# Patient Record
Sex: Female | Born: 1960 | Race: Black or African American | Hispanic: No | Marital: Single | Smoking: Former smoker
Health system: Southern US, Community
[De-identification: ages and names within clinical notes are randomized; demographics above are authoritative.]

## PROBLEM LIST (undated history)

## (undated) DIAGNOSIS — Z720 Tobacco use: Secondary | ICD-10-CM

## (undated) DIAGNOSIS — E7800 Pure hypercholesterolemia, unspecified: Secondary | ICD-10-CM

## (undated) DIAGNOSIS — Z789 Other specified health status: Secondary | ICD-10-CM

## (undated) DIAGNOSIS — K219 Gastro-esophageal reflux disease without esophagitis: Secondary | ICD-10-CM

## (undated) HISTORY — PX: TUBAL LIGATION: SHX77

## (undated) HISTORY — DX: Tobacco use: Z72.0

## (undated) HISTORY — PX: BACK SURGERY: SHX140

## (undated) HISTORY — PX: ABCESS DRAINAGE: SHX399

## (undated) HISTORY — DX: Gastro-esophageal reflux disease without esophagitis: K21.9

## (undated) HISTORY — PX: ANKLE SURGERY: SHX546

---

## 1998-11-12 ENCOUNTER — Ambulatory Visit (HOSPITAL_COMMUNITY): Admission: RE | Admit: 1998-11-12 | Discharge: 1998-11-12 | Payer: Self-pay | Admitting: Oral Surgery

## 1999-03-10 ENCOUNTER — Ambulatory Visit (HOSPITAL_BASED_OUTPATIENT_CLINIC_OR_DEPARTMENT_OTHER): Admission: RE | Admit: 1999-03-10 | Discharge: 1999-03-10 | Payer: Self-pay | Admitting: Oral Surgery

## 2001-07-29 ENCOUNTER — Emergency Department (HOSPITAL_COMMUNITY): Admission: EM | Admit: 2001-07-29 | Discharge: 2001-07-29 | Payer: Self-pay | Admitting: Emergency Medicine

## 2001-08-25 ENCOUNTER — Inpatient Hospital Stay (HOSPITAL_COMMUNITY): Admission: AD | Admit: 2001-08-25 | Discharge: 2001-08-25 | Payer: Self-pay | Admitting: Obstetrics & Gynecology

## 2001-09-07 ENCOUNTER — Encounter: Admission: RE | Admit: 2001-09-07 | Discharge: 2001-10-03 | Payer: Self-pay | Admitting: Orthopedic Surgery

## 2001-10-12 ENCOUNTER — Inpatient Hospital Stay (HOSPITAL_COMMUNITY): Admission: RE | Admit: 2001-10-12 | Discharge: 2001-10-14 | Payer: Self-pay | Admitting: Orthopaedic Surgery

## 2001-10-12 ENCOUNTER — Encounter: Payer: Self-pay | Admitting: Orthopedic Surgery

## 2001-10-25 ENCOUNTER — Encounter: Admission: RE | Admit: 2001-10-25 | Discharge: 2002-01-23 | Payer: Self-pay | Admitting: Orthopedic Surgery

## 2002-03-13 ENCOUNTER — Other Ambulatory Visit: Admission: RE | Admit: 2002-03-13 | Discharge: 2002-03-13 | Payer: Self-pay | Admitting: Obstetrics and Gynecology

## 2003-09-10 ENCOUNTER — Other Ambulatory Visit: Admission: RE | Admit: 2003-09-10 | Discharge: 2003-09-10 | Payer: Self-pay | Admitting: Obstetrics and Gynecology

## 2003-09-11 ENCOUNTER — Ambulatory Visit (HOSPITAL_COMMUNITY): Admission: RE | Admit: 2003-09-11 | Discharge: 2003-09-11 | Payer: Self-pay | Admitting: Obstetrics and Gynecology

## 2003-09-11 ENCOUNTER — Encounter: Payer: Self-pay | Admitting: Obstetrics and Gynecology

## 2004-10-02 ENCOUNTER — Other Ambulatory Visit: Admission: RE | Admit: 2004-10-02 | Discharge: 2004-10-02 | Payer: Self-pay | Admitting: Family Medicine

## 2004-11-12 ENCOUNTER — Ambulatory Visit (HOSPITAL_COMMUNITY): Admission: RE | Admit: 2004-11-12 | Discharge: 2004-11-12 | Payer: Self-pay | Admitting: Family Medicine

## 2005-10-05 ENCOUNTER — Other Ambulatory Visit: Admission: RE | Admit: 2005-10-05 | Discharge: 2005-10-05 | Payer: Self-pay | Admitting: Family Medicine

## 2006-01-08 ENCOUNTER — Ambulatory Visit (HOSPITAL_COMMUNITY): Admission: RE | Admit: 2006-01-08 | Discharge: 2006-01-08 | Payer: Self-pay | Admitting: Family Medicine

## 2006-02-08 ENCOUNTER — Emergency Department (HOSPITAL_COMMUNITY): Admission: EM | Admit: 2006-02-08 | Discharge: 2006-02-08 | Payer: Self-pay | Admitting: Emergency Medicine

## 2006-02-09 ENCOUNTER — Emergency Department (HOSPITAL_COMMUNITY): Admission: EM | Admit: 2006-02-09 | Discharge: 2006-02-09 | Payer: Self-pay | Admitting: Emergency Medicine

## 2006-02-21 ENCOUNTER — Emergency Department (HOSPITAL_COMMUNITY): Admission: EM | Admit: 2006-02-21 | Discharge: 2006-02-21 | Payer: Self-pay | Admitting: Emergency Medicine

## 2006-02-26 ENCOUNTER — Encounter: Admission: RE | Admit: 2006-02-26 | Discharge: 2006-05-27 | Payer: Self-pay | Admitting: Family Medicine

## 2006-07-30 ENCOUNTER — Encounter: Admission: RE | Admit: 2006-07-30 | Discharge: 2006-07-30 | Payer: Self-pay | Admitting: Family Medicine

## 2006-10-21 ENCOUNTER — Other Ambulatory Visit: Admission: RE | Admit: 2006-10-21 | Discharge: 2006-10-21 | Payer: Self-pay | Admitting: Family Medicine

## 2007-10-25 ENCOUNTER — Other Ambulatory Visit: Admission: RE | Admit: 2007-10-25 | Discharge: 2007-10-25 | Payer: Self-pay | Admitting: Family Medicine

## 2008-03-02 ENCOUNTER — Emergency Department (HOSPITAL_COMMUNITY): Admission: EM | Admit: 2008-03-02 | Discharge: 2008-03-02 | Payer: Self-pay | Admitting: Emergency Medicine

## 2008-10-24 ENCOUNTER — Other Ambulatory Visit: Admission: RE | Admit: 2008-10-24 | Discharge: 2008-10-24 | Payer: Self-pay | Admitting: Family Medicine

## 2009-08-04 ENCOUNTER — Emergency Department (HOSPITAL_COMMUNITY): Admission: EM | Admit: 2009-08-04 | Discharge: 2009-08-04 | Payer: Self-pay | Admitting: Emergency Medicine

## 2009-08-09 ENCOUNTER — Inpatient Hospital Stay (HOSPITAL_COMMUNITY): Admission: RE | Admit: 2009-08-09 | Discharge: 2009-08-12 | Payer: Self-pay | Admitting: Orthopaedic Surgery

## 2009-11-01 ENCOUNTER — Other Ambulatory Visit: Admission: RE | Admit: 2009-11-01 | Discharge: 2009-11-01 | Payer: Self-pay | Admitting: Family Medicine

## 2011-01-18 ENCOUNTER — Encounter: Payer: Self-pay | Admitting: Family Medicine

## 2011-04-04 LAB — CBC
HCT: 36.3 % (ref 36.0–46.0)
Hemoglobin: 12.3 g/dL (ref 12.0–15.0)
MCV: 85.8 fL (ref 78.0–100.0)
Platelets: 334 10*3/uL (ref 150–400)
RDW: 13.2 % (ref 11.5–15.5)
WBC: 5.8 10*3/uL (ref 4.0–10.5)

## 2011-05-12 NOTE — Op Note (Signed)
NAMEMISSOURI, LAPAGLIA                ACCOUNT NO.:  [id number]   MEDICAL RECORD NO.:  [id number]          PATIENT TYPE:  OIB   LOCATION:  5040                         FACILITY:  [facility]   PHYSICIAN:  Vanita Panda. Magnus Ivan, M.D.DATE OF BIRTH:  19-Nov-1961   DATE OF PROCEDURE:  08/09/2009  DATE OF DISCHARGE:                               OPERATIVE REPORT   PREOPERATIVE DIAGNOSES:  Left complex ankle fracture, left complex and  unstable bimalleolar ankle fracture.   POSTOPERATIVE DIAGNOSES:  Left complex ankle fracture, left complex and  unstable bimalleolar ankle fracture.   PROCEDURES:  1. Open reduction and internal fixation of left fibula.  2. Excision of left small medial malleolus piece.  3. Repair of deltoid ligament, left ankle.   SURGEON:  Vanita Panda. Magnus Ivan, MD   ANESTHESIA:  1. Popliteal block, left leg.  2. General.   TOURNIQUET TIME:  85 minutes.   BLOOD LOSS:  Minimal.   COMPLICATIONS:  None.   INDICATIONS:  Briefly, Megan Spears is a 50 year old who last weekend fell  off her porch sustaining injury to her left ankle, ankle subluxation  with fractures of the fibula, and a small medial malleolus piece, this  was an unstable injury.  She was placed appropriately in the splint and  given followup in my office.  She was recommended open reduction and  internal fixation.  She understood the need to proceed with surgery.   DESCRIPTION OF PROCEDURE:  After informed consent was obtained, the  appropriate left ankle was marked.  Anesthesia obtained a popliteal  block.  She was then brought to the operating room, placed supine on the  operating table.  General anesthesia was then obtained.  A bump was  placed under her left hip to internally rotate her leg.  A nonsterile  tourniquet was placed around her upper thigh and her leg was prepped and  draped with DuraPrep and sterile drapes including sterile stockinette.  A time-out was called to identify the correct patient  and correct left  ankle.  I then used Esmarch to wrap up the ankle and tourniquet was  inflated to 300 mL pressure.  An incision was made over the fibula and  carried down to the fracture site.  I cleaned the fracture debris of  hematoma.  You could see there was a straight port oblique fracture.  I  then used reduction forceps and reduced the fracture anatomically.  I  attempted to place a lag screw from anterior to posterior, but it was  difficult to get the correct angle due to her small area of bone.  At  that point, we placed buttress plate instead.  Katrinka Blazing and nephew anatomic  distal fibular plate was then chosen and I was able to reduce the plate  along the lateral cortex of the fibula.  I then secured this with 3  screws proximally and 3 screws distally, and this held the fracture in  anatomically reduced position.  Next, attention was turned to the small  medial malleolus piece.  An incision was made directly in the medial  malleolus and the piece  was found to be small with some cartilage  involved with too small to put any type of screw in.  I excised this  piece and was able to bring the deltoid ligament back to its resting  position.  I repaired this deltoid tear with interrupted #2 FiberWire  suture.  Under direct fluoroscopic guidance, I assessed the fracture as  well as the stability of the ankle mortise and syndesmosis, stressed in  the ankle joint, and direct fluoroscopy showed no instability.  I then  copiously irrigated the wounds and closed the deep tissues over both  areas with 0 Vicryl followed by 2-0 Vicryl on subcutaneous tissue and  interrupted 2-0 nylon on the skin.  The skin incisions were infiltrated  with 0.25% plain Marcaine.  Xeroform followed by well-padded sterile  dressing and splint was placed on her ankle.  The tourniquet was let  down at 85 minutes and toes pink and nicely.  The patient was then  awakened, extubated, and taken to recovery room in stable  condition.  All final counts were correct and there were no complications noted.      Vanita Panda. Magnus Ivan, M.D.  Electronically Signed     CYB/MEDQ  D:  08/09/2009  T:  08/10/2009  Job:  [id number]

## 2011-05-12 NOTE — Discharge Summary (Signed)
NAMECHASIDY, Megan Spears                ACCOUNT NO.:  [id number]   MEDICAL RECORD NO.:  [id number]          PATIENT TYPE:  INP   LOCATION:  5040                         FACILITY:  [facility]   PHYSICIAN:  Vanita Panda. Magnus Ivan, M.D.DATE OF BIRTH:  June 04, 1961   DATE OF ADMISSION:  08/09/2009  DATE OF DISCHARGE:  08/12/2009                               DISCHARGE SUMMARY   PREOPERATIVE DIAGNOSIS:  Left unstable ankle fracture.   DISCHARGE DIAGNOSIS:  Left unstable ankle fracture.   PREOPERATIVE PROCEDURE:  Open reduction and internal fixation of left  fibula and repair of left deltoid ligament on April 2010.   HOSPITAL COURSE:  [name] is a 50 year old female who sustained a  mechanical fall about 6 days prior to admission.  She was found to have  an ankle fracture subluxation, who has unstable fracture.  After a  period of time, it allow swelling to subside.  She was taken to the  operating room on the day of admission where she underwent open  reduction and internal fixation of the left fibular fracture and then  repair of deltoid ligament disruption.  For detailed description of the  operation, please refer the dictated operative in the patient medical  record.  Postoperatively, she was admitted to the orthopedic floor bed.  We kept her over the week after the weekend due to the fact that she  needs extra help with physical therapy and that she lives alone, a 3 in  1 bedside commode was set up for delivery for her.  By the day of  discharge, she was safe from physical therapy standpoint.  She was  tolerating oral diet as well as oral pain medications.  This was felt,  she could be discharged safely to home.   DISPOSITION:  To home.   DISCHARGE INSTRUCTIONS:  While she is at home, she will remain  nonweightbearing on her left lower extremity.  She will elevate this  extremity for swelling and follow up appointment will be established in  the office in 2 weeks.   DISCHARGE  MEDICATIONS:  Percocet.      Vanita Panda. Magnus Ivan, M.D.  Electronically Signed     CYB/MEDQ  D:  08/12/2009  T:  08/12/2009  Job:  [id number]

## 2011-05-15 NOTE — Op Note (Signed)
[place]. [facility]  Patient:    Megan Spears, Megan Spears Visit Number: [id number] MRN: [id number]          Service Type: DSU Location: 5000 [id number] Attending Physician:  Susanne Greenhouse Dictated by:   Patricia Nettle, M.D. Proc. Date: 10/12/01 Admit Date:  10/12/2001                             Operative Report  PREOPERATIVE DIAGNOSIS: 1. Right L5 radiculopathy. 2. Lateral recess stenosis, L4-5. 3. Foraminal stenosis L5-S1. 4. Degenerative disk disease L4-5, L5-S1.  POSTOPERATIVE DIAGNOSIS:  OPERATION PERFORMED: 1. Right L5 hemilaminectomy. 2. Right L4-5 laminotomy. 3. Partial facetectomy, L4-5. 4. Foraminotomy, L5-S1 right. 5. Microdiskectomy L4-5. 6. Microdiskectomy L5-S1. 7. Autogenous fat graft.  SURGEON:  Dr. Sharolyn Douglas, M.D.  ASSISTANT:  Greig Castilla, [facility] PA  ANESTHESIA:  General endotracheal.  ESTIMATED BLOOD LOSS:  100 cc.  COMPLICATIONS:  None.  INDICATIONS FOR PROCEDURE:  The patient is a 50 year old female with right lower extremity pain.  MRI showed degenerative disk disease at L4-5 and L5-S1 with bilateral foraminal stenosis at L5-S1.  There is lateral recess stenosis at the L4-5 level.  She also complains of right upper extremity pain and has an MRI scan showing a right posterolateral disk protrusion at C5-6.  She has 3/5 weakness in the right extensor hallucis longus.  She has failed conservative therapy.  DESCRIPTION OF PROCEDURE:  The patient was properly identified in the holding area as Carvel Getting and taken to the operating room.  She underwent general endotracheal anesthesia without difficulty.  She was turned prone onto the AcroMed four posterior positioning frame.  Care was taken to pad all bony prominence.  Prophylactic antibiotics were given.  Her back was prepped and draped in the usual sterile fashion.  A 5 cm midline incision was made over the L4 to S1 spinous processes.  Dissection was carried down to the  fascia. The fascia was incised and the paraspinal musculature was exposed through the facet joints.  The lamina and interspaces were defined.  Intraoperative x-ray was taken to confirm levels.  A right L5 hemilaminectomy was performed.  A laminotomy was carried out, taking the inferior third of the L4 right lamina. The L5 nerve root was identified and found to compressed within the lateral recess from ligamentum flavum hypertrophy as well as L5 superior facet overgrowth. This was decompressed laterally to the medial pedicle wall.  A foraminotomy was then carried out at the L5-S1 level tracing the nerve out and through the foramen.  Care was taken to protect the pars interarticularis at L5.  Diskectomies were carried out at the L4-5 and L5-S1 levels to further decompress the L5 root.  The wound was copiously irrigated.  Adequate hemostasis was achieved.  The nerve was again examined and found to be mobile and passing freely through the L5-S1 foramen.  The fascia was closed using #1 running Vicryl sutures.  The subcutaneous tissue was approximated using 2-0 Vicryl suture.  A subcutaneous drain was placed.  The skin was approximated using 3-0 Monocryl subcuticular sutures.  Sterile dressing was applied.  The patient was flipped supine.  She was extubated without difficulty.  She was wiggling her toes and transferred to recovery room in stable condition. Dictated by:   Patricia Nettle, M.D. Attending Physician:  Susanne Greenhouse DD:  10/12/01 TD:  10/13/01 Job: [id number]

## 2011-05-15 NOTE — H&P (Signed)
[place]. [facility]  Patient:    Megan Spears, Megan Spears Visit Number: [id number] MRN: [id number]          Service Type: Attending:  Patricia Nettle, M.D. Dictated by:   Ralene Bathe, P.A.                           History and Physical  DATE OF BIRTH:  26-Nov-1961.  CHIEF COMPLAINT:  Back and right leg pain.  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  She had sudden onset of right buttock pain and was unable to complete a normal shift and went on to develop right lower extremity pain with radiation to the calf and dorsum of the foot.  She was initially seen in [facility] ER as well as [facility] prior to being referred to our office.  At that time straight leg raise was positive and EHL was weak on the right when compared to the left.  She was sent for MRI, which demonstrated degenerative disk disease at L4-5, L5-S1, with foraminal stenosis L5-S1, right greater than left.  The patient has been unable to work.  She has also _____ got a disk herniation in the cervical spine as well, and MRI demonstrated 5-6 causing mass effect on the right C6 nerve root.  She has also had demonstrated weakness into the upper extremities.  The back symptoms preceded the neck; therefore, it was decided to undergo lumbar decompression first and to give the neck a chance to conservatively improve.  At this time she wishes to proceed.  She wishes to return back into the work force.  At this time L4-5, L5-S1 laminectomies are indicated and risks and benefits discussed with the patient, and she wished to proceed.  PAST MEDICAL HISTORY:  None.  PAST SURGICAL HISTORY: 1. Removal of a parotid cyst on the right. 2. BTL.  MEDICATIONS:  Vicodin, Voltaren.  ALLERGIES:  No known drug allergies.  SOCIAL HISTORY:  The patient has children, one is 90 and one is 70.  She dyspnea on exertion smoke, uses  occasional alcohol.  FAMILY HISTORY:  Negative for diabetes, strokes, heart disease.  REVIEW OF SYSTEMS:  The patient denies any recent fevers, chills, night sweats.  No bleeding tendencies.  CENTRAL NERVOUS SYSTEM:  No blurred or double vision, seizures, headaches, or paralysis.  RESPIRATORY:  No shortness of breath, productive cough, hemoptysis.  CARDIOVASCULAR:  No chest pain, angina, orthopnea.  GASTROINTESTINAL:  No nausea, vomiting, constipation, melena, or bloody stools.  GENITOURINARY:  No dysuria or hematuria. MUSCULOSKELETAL:  As pertinent to present illness.  PHYSICAL EXAMINATION:  VITAL SIGNS:  Pulse 72, respirations 12, blood pressure is 122/82.  GENERAL:  A well-developed, well-nourished 50 year old female.  HEENT:  Normocephalic.  Extraocular motions intact.  NECK:  Supple.  No lymphadenopathy.  No carotid bruits appreciated on exam.  CHEST:  Clear to auscultation bilaterally.  No rales or rhonchi.  CARDIAC:  Regular rate and rhythm.  No murmurs, gallops, rubs, heaves.  ABDOMEN:  Positive bowel sounds, soft, nontender.  EXTREMITIES:  As in history of present illness.  IMPRESSION: 1. L4-5, L5-S1 herniated nucleus pulposus, degenerative disk disease, with    foraminal stenosis. 2. C5-6 right herniated disk with right C6 radiculopathy.  PLAN:  The patient will be admitted for lumbar decompression and laminectomy.  Dictated by:   Ralene Bathe, P.A.  Attending:  Patricia Nettle, M.D. DD:  09/27/01 TD:  10/12/01 Job: [id number]

## 2011-09-18 ENCOUNTER — Inpatient Hospital Stay (INDEPENDENT_AMBULATORY_CARE_PROVIDER_SITE_OTHER)
Admission: RE | Admit: 2011-09-18 | Discharge: 2011-09-18 | Disposition: A | Discharge status: Home / Self Care | Payer: Self-pay | Source: Ambulatory Visit | Attending: Family Medicine | Admitting: Family Medicine

## 2011-09-18 DIAGNOSIS — L255 Unspecified contact dermatitis due to plants, except food: Secondary | ICD-10-CM

## 2011-12-14 ENCOUNTER — Emergency Department (HOSPITAL_COMMUNITY)
Admission: EM | Admit: 2011-12-14 | Discharge: 2011-12-14 | Disposition: A | Discharge status: Home / Self Care | Payer: PRIVATE HEALTH INSURANCE | Source: Home / Self Care | Attending: Family Medicine | Admitting: Family Medicine

## 2011-12-14 DIAGNOSIS — K0510 Chronic gingivitis, plaque induced: Secondary | ICD-10-CM

## 2011-12-14 DIAGNOSIS — K089 Disorder of teeth and supporting structures, unspecified: Secondary | ICD-10-CM

## 2011-12-14 DIAGNOSIS — K0889 Other specified disorders of teeth and supporting structures: Secondary | ICD-10-CM

## 2011-12-14 HISTORY — DX: Pure hypercholesterolemia, unspecified: E78.00

## 2011-12-14 MED ORDER — AMOXICILLIN 500 MG PO CAPS: 500.0000 mg | ORAL_CAPSULE | Freq: Three times a day (TID) | ORAL | Status: AC

## 2011-12-14 MED ORDER — HYDROCODONE-ACETAMINOPHEN 5-325 MG PO TABS: ORAL_TABLET | ORAL | Status: AC

## 2011-12-14 NOTE — ED Notes (Signed)
1 day hx of left bottom toothache.  Tooth has been hurting for past two years, and dentist has stated she needed cash money to pull dentist.

## 2011-12-14 NOTE — ED Provider Notes (Signed)
History     CSN: [id number] Arrival date & time: 12/14/2011  8:41 AM   First MD Initiated Contact with Patient 12/14/11 [phone]      Chief Complaint  Patient presents with  . Dental Pain    1 day hx of left bottom toothache    (Consider location/radiation/quality/duration/timing/severity/associated sxs/prior treatment) HPI Comments: Megan Spears presents for evaluation of pain in her LEFT lower teeth and gums. She reports the pain has been intermittent for 2 years. She has seen several dentists, including [facility], but was unable to afford the upfront charges.   Patient is a 50 y.o. female presenting with tooth pain. The history is provided by the patient.  Dental PainThe primary symptoms include mouth pain. Primary symptoms do not include dental injury or oral bleeding. The symptoms began more than 1 month ago. The symptoms are unchanged. The symptoms are chronic. The symptoms occur intermittently.  Mouth pain began more than 1 month ago. Mouth pain occurs intermittently. Mouth pain is unchanged. Affected locations include: teeth and gum(s).  Additional symptoms include: gum swelling and gum tenderness. Additional symptoms do not include: purulent gums, facial swelling, trouble swallowing and pain with swallowing. Medical issues include: periodontal disease.    Past Medical History  Diagnosis Date  . Hypercholesteremia     Past Surgical History  Procedure Date  . Back surgery     History reviewed. No pertinent family history.  History  Substance Use Topics  . Smoking status: Current Everyday Smoker -- 0.5 packs/day  . Smokeless tobacco: Never Used  . Alcohol Use: Yes     occational    OB History    Grav Para Term Preterm Abortions TAB SAB Ect Mult Living                  Review of Systems  Constitutional: Negative.   HENT: Positive for dental problem. Negative for facial swelling and trouble swallowing.   Eyes: Negative.   Respiratory: Negative.     Cardiovascular: Negative.   Gastrointestinal: Negative.   Genitourinary: Negative.   Musculoskeletal: Negative.   Skin: Negative.   Neurological: Negative.     Allergies  Review of patient's allergies indicates no known allergies.  Home Medications   Current Outpatient Rx  Name Route Sig Dispense Refill  . ASPIRIN 81 MG PO TABS Oral Take 81 mg by mouth daily.      . MULTI-VITAMIN/MINERALS PO TABS Oral Take 1 tablet by mouth daily.      . AMOXICILLIN 500 MG PO CAPS Oral Take 1 capsule (500 mg total) by mouth 3 (three) times daily. 30 capsule 0  . HYDROCODONE-ACETAMINOPHEN 5-325 MG PO TABS  Take one to two tablets every 4 to 6 hours as needed for pain 20 tablet 0    BP 155/96  Pulse 83  Temp(Src) 97.7 F (36.5 C) (Oral)  Resp 22  SpO2 100%  Physical Exam  Nursing note and vitals reviewed. Constitutional: She is oriented to person, place, and time. She appears well-developed and well-nourished.  HENT:  Head: Normocephalic and atraumatic.  Mouth/Throat: Uvula is midline, oropharynx is clear and moist and mucous membranes are normal. No oral lesions. Dental caries present. No dental abscesses.    Eyes: EOM are normal.  Neck: Normal range of motion.  Pulmonary/Chest: Effort normal.  Musculoskeletal: Normal range of motion.  Neurological: She is alert and oriented to person, place, and time.  Skin: Skin is warm and dry.  Psychiatric: Her behavior is normal.  ED Course  Procedures (including critical care time)  Labs Reviewed - No data to display No results found.   1. Pain, dental   2. Gingivitis       MDM  List of dental providers given to patient        Richardo Priest, MD 12/14/11 [phone]

## 2012-07-25 ENCOUNTER — Other Ambulatory Visit (HOSPITAL_COMMUNITY): Payer: Self-pay | Admitting: Family Medicine

## 2012-07-25 DIAGNOSIS — Z1231 Encounter for screening mammogram for malignant neoplasm of breast: Secondary | ICD-10-CM

## 2012-09-30 ENCOUNTER — Emergency Department (HOSPITAL_COMMUNITY): Payer: PRIVATE HEALTH INSURANCE

## 2012-09-30 ENCOUNTER — Emergency Department (HOSPITAL_COMMUNITY)
Admission: EM | Admit: 2012-09-30 | Discharge: 2012-10-01 | Disposition: A | Discharge status: Home / Self Care | Payer: PRIVATE HEALTH INSURANCE | Attending: Emergency Medicine | Admitting: Emergency Medicine

## 2012-09-30 ENCOUNTER — Encounter (HOSPITAL_COMMUNITY): Payer: Self-pay | Admitting: Emergency Medicine

## 2012-09-30 DIAGNOSIS — M25579 Pain in unspecified ankle and joints of unspecified foot: Secondary | ICD-10-CM | POA: Insufficient documentation

## 2012-09-30 DIAGNOSIS — T07XXXA Unspecified multiple injuries, initial encounter: Secondary | ICD-10-CM | POA: Insufficient documentation

## 2012-09-30 DIAGNOSIS — M79609 Pain in unspecified limb: Secondary | ICD-10-CM | POA: Insufficient documentation

## 2012-09-30 DIAGNOSIS — S59909A Unspecified injury of unspecified elbow, initial encounter: Secondary | ICD-10-CM | POA: Insufficient documentation

## 2012-09-30 DIAGNOSIS — R079 Chest pain, unspecified: Secondary | ICD-10-CM | POA: Insufficient documentation

## 2012-09-30 DIAGNOSIS — M542 Cervicalgia: Secondary | ICD-10-CM | POA: Insufficient documentation

## 2012-09-30 DIAGNOSIS — M5030 Other cervical disc degeneration, unspecified cervical region: Secondary | ICD-10-CM | POA: Insufficient documentation

## 2012-09-30 DIAGNOSIS — S6990XA Unspecified injury of unspecified wrist, hand and finger(s), initial encounter: Secondary | ICD-10-CM | POA: Insufficient documentation

## 2012-09-30 LAB — COMPREHENSIVE METABOLIC PANEL
AST: 49 U/L — ABNORMAL HIGH (ref 0–37)
Albumin: 3.7 g/dL (ref 3.5–5.2)
BUN: 14 mg/dL (ref 6–23)
Calcium: 8.9 mg/dL (ref 8.4–10.5)
Creatinine, Ser: 0.71 mg/dL (ref 0.50–1.10)
Total Bilirubin: 0.4 mg/dL (ref 0.3–1.2)
Total Protein: 6.7 g/dL (ref 6.0–8.3)

## 2012-09-30 LAB — CBC WITH DIFFERENTIAL/PLATELET
Basophils Absolute: 0.1 10*3/uL (ref 0.0–0.1)
Basophils Relative: 1 % (ref 0–1)
Eosinophils Absolute: 0.2 10*3/uL (ref 0.0–0.7)
Eosinophils Relative: 2 % (ref 0–5)
HCT: 35.5 % — ABNORMAL LOW (ref 36.0–46.0)
Hemoglobin: 12.5 g/dL (ref 12.0–15.0)
MCH: 28.9 pg (ref 26.0–34.0)
MCHC: 35.2 g/dL (ref 30.0–36.0)
Monocytes Absolute: 0.6 10*3/uL (ref 0.1–1.0)
Monocytes Relative: 6 % (ref 3–12)
Neutro Abs: 5.5 10*3/uL (ref 1.7–7.7)
RDW: 14 % (ref 11.5–15.5)

## 2012-09-30 MED ORDER — TETANUS-DIPHTH-ACELL PERTUSSIS 5-2.5-18.5 LF-MCG/0.5 IM SUSP
0.5000 mL | Freq: Once | INTRAMUSCULAR | Status: AC
  Administered 2012-09-30: 0.5 mL via INTRAMUSCULAR
  Filled 2012-09-30: qty 0.5

## 2012-09-30 MED ORDER — MORPHINE SULFATE 4 MG/ML IJ SOLN
4.0000 mg | Freq: Once | INTRAMUSCULAR | Status: AC
  Administered 2012-09-30: 4 mg via INTRAVENOUS
  Filled 2012-09-30: qty 1

## 2012-09-30 MED ORDER — HYDROCODONE-ACETAMINOPHEN 5-325 MG PO TABS
2.0000 | ORAL_TABLET | Freq: Once | ORAL | Status: AC
  Administered 2012-09-30: 2 via ORAL
  Filled 2012-09-30: qty 2

## 2012-09-30 MED ORDER — ONDANSETRON HCL 4 MG/2ML IJ SOLN
4.0000 mg | Freq: Once | INTRAMUSCULAR | Status: AC
  Administered 2012-09-30: 4 mg via INTRAVENOUS
  Filled 2012-09-30: qty 2
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
## 2012-09-30 NOTE — ED Notes (Signed)
Pt to xray
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
## 2012-09-30 NOTE — ED Notes (Signed)
Pt c/o generalized pain can not pinpoint any particular area of focused pain.

## 2012-10-01 MED ORDER — HYDROCODONE-ACETAMINOPHEN 5-325 MG PO TABS: 2.0000 | ORAL_TABLET | ORAL | Status: DC | PRN

## 2012-10-01 MED ORDER — IOHEXOL 300 MG/ML  SOLN
100.0000 mL | Freq: Once | INTRAMUSCULAR | Status: AC | PRN
  Administered 2012-10-01: 100 mL via INTRAVENOUS

## 2012-10-01 MED ORDER — IBUPROFEN 800 MG PO TABS: 800.0000 mg | ORAL_TABLET | Freq: Three times a day (TID) | ORAL | Status: DC

## 2012-10-01 NOTE — ED Notes (Signed)
Pt return from xray. Ortho at bedside,

## 2012-10-01 NOTE — Progress Notes (Signed)
Orthopedic Tech Progress Note Patient Details:  Megan Spears Sep 02, 1961 [id number]  Ortho Devices Type of Ortho Device: Velcro wrist splint   Megan Spears 10/01/2012, 12:05 AM

## 2012-10-03 ENCOUNTER — Emergency Department (HOSPITAL_COMMUNITY)
Admission: EM | Admit: 2012-10-03 | Discharge: 2012-10-03 | Discharge status: Against Medical Advice | Payer: No Typology Code available for payment source

## 2012-12-24 ENCOUNTER — Encounter (HOSPITAL_COMMUNITY): Payer: Self-pay | Admitting: *Deleted

## 2012-12-24 ENCOUNTER — Emergency Department (HOSPITAL_COMMUNITY)
Admission: EM | Admit: 2012-12-24 | Discharge: 2012-12-24 | Disposition: A | Discharge status: Home / Self Care | Payer: PRIVATE HEALTH INSURANCE | Attending: Emergency Medicine | Admitting: Emergency Medicine

## 2012-12-24 DIAGNOSIS — F17200 Nicotine dependence, unspecified, uncomplicated: Secondary | ICD-10-CM | POA: Insufficient documentation

## 2012-12-24 DIAGNOSIS — M25519 Pain in unspecified shoulder: Secondary | ICD-10-CM | POA: Insufficient documentation

## 2012-12-24 DIAGNOSIS — E7800 Pure hypercholesterolemia, unspecified: Secondary | ICD-10-CM | POA: Insufficient documentation

## 2012-12-24 DIAGNOSIS — Z79899 Other long term (current) drug therapy: Secondary | ICD-10-CM | POA: Insufficient documentation

## 2012-12-24 DIAGNOSIS — Z7982 Long term (current) use of aspirin: Secondary | ICD-10-CM | POA: Insufficient documentation

## 2012-12-24 DIAGNOSIS — M5412 Radiculopathy, cervical region: Secondary | ICD-10-CM | POA: Insufficient documentation

## 2012-12-24 DIAGNOSIS — R209 Unspecified disturbances of skin sensation: Secondary | ICD-10-CM | POA: Insufficient documentation

## 2012-12-24 MED ORDER — PREDNISONE 20 MG PO TABS: 60.0000 mg | ORAL_TABLET | Freq: Every day | ORAL | Status: DC

## 2012-12-24 MED ORDER — OXYCODONE-ACETAMINOPHEN 5-325 MG PO TABS: 1.0000 | ORAL_TABLET | Freq: Four times a day (QID) | ORAL | Status: DC | PRN

## 2012-12-24 MED ORDER — DIAZEPAM 5 MG PO TABS: 5.0000 mg | ORAL_TABLET | Freq: Four times a day (QID) | ORAL | Status: DC | PRN
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
## 2013-02-03 ENCOUNTER — Other Ambulatory Visit: Payer: Self-pay | Admitting: Neurological Surgery

## 2013-02-03 DIAGNOSIS — M47812 Spondylosis without myelopathy or radiculopathy, cervical region: Secondary | ICD-10-CM

## 2013-02-10 ENCOUNTER — Other Ambulatory Visit: Payer: PRIVATE HEALTH INSURANCE

## 2013-02-11 ENCOUNTER — Ambulatory Visit
Admission: RE | Admit: 2013-02-11 | Discharge: 2013-02-11 | Disposition: A | Discharge status: Home / Self Care | Payer: PRIVATE HEALTH INSURANCE | Source: Ambulatory Visit | Attending: Neurological Surgery | Admitting: Neurological Surgery

## 2013-02-11 DIAGNOSIS — M47812 Spondylosis without myelopathy or radiculopathy, cervical region: Secondary | ICD-10-CM

## 2013-02-17 ENCOUNTER — Other Ambulatory Visit: Payer: Self-pay | Admitting: Neurological Surgery

## 2013-02-24 ENCOUNTER — Encounter (HOSPITAL_COMMUNITY): Payer: Self-pay | Admitting: Pharmacy Technician

## 2013-03-01 NOTE — Pre-Procedure Instructions (Signed)
Megan Spears  03/01/2013   Your procedure is scheduled on:  Thursday March 09, 2013  Report to [facility] at 10:30 AM.  Call this number if you have problems the morning of surgery: [phone]   Remember:   Do not eat food or drink liquids after midnight.   Take these medicines the morning of surgery with A SIP OF WATER: none   Do not wear jewelry, make-up or nail polish.  Do not wear lotions, powders, or perfumes.  Do not shave 48 hours prior to surgery.  Do not bring valuables to the hospital.  Contacts, dentures or bridgework may not be worn into surgery.  Leave suitcase in the car. After surgery it may be brought to your room.  For patients admitted to the hospital, checkout time is 11:00 AM the day of  discharge.   Patients discharged the day of surgery will not be allowed to drive home.  Name and phone number of your driver: family / friend  Special Instructions: Shower using CHG 2 nights before surgery and the night before surgery.  If you shower the day of surgery use CHG.  Use special wash - you have one bottle of CHG for all showers.  You should use approximately 1/3 of the bottle for each shower.   Please read over the following fact sheets that you were given: Pain Booklet, Coughing and Deep Breathing, MRSA Information and Surgical Site Infection Prevention

## 2013-03-02 ENCOUNTER — Other Ambulatory Visit (HOSPITAL_COMMUNITY): Payer: Self-pay | Admitting: Family Medicine

## 2013-03-02 ENCOUNTER — Encounter (HOSPITAL_COMMUNITY): Payer: Self-pay

## 2013-03-02 ENCOUNTER — Encounter (HOSPITAL_COMMUNITY)
Admission: RE | Admit: 2013-03-02 | Discharge: 2013-03-02 | Disposition: A | Discharge status: Home / Self Care | Payer: PRIVATE HEALTH INSURANCE | Source: Ambulatory Visit | Attending: Neurological Surgery | Admitting: Neurological Surgery

## 2013-03-02 HISTORY — DX: Other specified health status: Z78.9

## 2013-03-02 LAB — BASIC METABOLIC PANEL
BUN: 10 mg/dL (ref 6–23)
GFR calc Af Amer: >90 mL/min (ref >90)
GFR calc non Af Amer: >90 mL/min (ref >90)
Potassium: 4.2 mEq/L (ref 3.5–5.1)
Sodium: 140 mEq/L (ref 135–145)

## 2013-03-02 LAB — CBC WITH DIFFERENTIAL/PLATELET
Basophils Absolute: 0 10*3/uL (ref 0.0–0.1)
Basophils Relative: 0 % (ref 0–1)
Eosinophils Absolute: 0.1 10*3/uL (ref 0.0–0.7)
MCH: 27.9 pg (ref 26.0–34.0)
MCHC: 35.5 g/dL (ref 30.0–36.0)
Monocytes Relative: 8 % (ref 3–12)
Neutro Abs: 4.1 10*3/uL (ref 1.7–7.7)
Neutrophils Relative %: 60 % (ref 43–77)
Platelets: 323 10*3/uL (ref 150–400)
RDW: 14.2 % (ref 11.5–15.5)

## 2013-03-02 LAB — PROTIME-INR: Prothrombin Time: 12 seconds (ref 11.6–15.2)

## 2013-03-02 LAB — SURGICAL PCR SCREEN
MRSA, PCR: NEGATIVE
Staphylococcus aureus: NEGATIVE

## 2013-03-07 ENCOUNTER — Ambulatory Visit (HOSPITAL_COMMUNITY)
Admission: RE | Admit: 2013-03-07 | Discharge: 2013-03-07 | Disposition: A | Discharge status: Home / Self Care | Payer: PRIVATE HEALTH INSURANCE | Source: Ambulatory Visit | Attending: Family Medicine | Admitting: Family Medicine

## 2013-03-07 DIAGNOSIS — Z01818 Encounter for other preprocedural examination: Secondary | ICD-10-CM | POA: Insufficient documentation

## 2013-03-07 DIAGNOSIS — Z0000 Encounter for general adult medical examination without abnormal findings: Secondary | ICD-10-CM

## 2013-03-07 DIAGNOSIS — Z1231 Encounter for screening mammogram for malignant neoplasm of breast: Secondary | ICD-10-CM | POA: Insufficient documentation

## 2013-03-07 DIAGNOSIS — Z01812 Encounter for preprocedural laboratory examination: Secondary | ICD-10-CM | POA: Insufficient documentation

## 2013-03-08 MED ORDER — CEFAZOLIN SODIUM-DEXTROSE 2-3 GM-% IV SOLR
2.0000 g | INTRAVENOUS | Status: DC
  Filled 2013-03-08: qty 50

## 2013-03-09 ENCOUNTER — Ambulatory Visit (HOSPITAL_COMMUNITY)
Admission: RE | Admit: 2013-03-09 | Discharge: 2013-03-09 | Disposition: A | Discharge status: Home / Self Care | Payer: PRIVATE HEALTH INSURANCE | Source: Ambulatory Visit | Attending: Neurological Surgery | Admitting: Neurological Surgery

## 2013-03-09 ENCOUNTER — Encounter (HOSPITAL_COMMUNITY): Payer: Self-pay | Admitting: Anesthesiology

## 2013-03-09 ENCOUNTER — Encounter (HOSPITAL_COMMUNITY)
Admission: RE | Disposition: A | Discharge status: Home / Self Care | Payer: Self-pay | Source: Ambulatory Visit | Attending: Neurological Surgery

## 2013-03-09 ENCOUNTER — Encounter (HOSPITAL_COMMUNITY): Payer: Self-pay | Admitting: Pharmacy Technician

## 2013-03-09 DIAGNOSIS — M479 Spondylosis, unspecified: Secondary | ICD-10-CM | POA: Insufficient documentation

## 2013-03-09 DIAGNOSIS — Z538 Procedure and treatment not carried out for other reasons: Secondary | ICD-10-CM | POA: Insufficient documentation

## 2013-03-09 SURGERY — CANCELLED PROCEDURE

## 2013-03-09 MED ORDER — DEXAMETHASONE SODIUM PHOSPHATE 10 MG/ML IJ SOLN
INTRAMUSCULAR | Status: AC
  Filled 2013-03-09: qty 1

## 2013-03-09 MED ORDER — DEXAMETHASONE SODIUM PHOSPHATE 10 MG/ML IJ SOLN: 10.0000 mg | INTRAMUSCULAR | Status: DC

## 2013-03-09 SURGICAL SUPPLY — 37 items
APL SKNCLS STERI-STRIP NONHPOA (GAUZE/BANDAGES/DRESSINGS) ×1
BAG DECANTER FOR FLEXI CONT (MISCELLANEOUS) ×2 IMPLANT
BENZOIN TINCTURE PRP APPL 2/3 (GAUZE/BANDAGES/DRESSINGS) ×2 IMPLANT
CANISTER SUCTION 2500CC (MISCELLANEOUS) ×2 IMPLANT
CLOTH BEACON ORANGE TIMEOUT ST (SAFETY) ×2 IMPLANT
CONT SPEC 4OZ CLIKSEAL STRL BL (MISCELLANEOUS) ×2 IMPLANT
DRAPE C-ARM 42X72 X-RAY (DRAPES) ×4 IMPLANT
DRAPE LAPAROTOMY 100X72 PEDS (DRAPES) ×2 IMPLANT
DRAPE MICROSCOPE ZEISS OPMI (DRAPES) ×2 IMPLANT
DRAPE POUCH INSTRU U-SHP 10X18 (DRAPES) ×2 IMPLANT
DRESSING TELFA 8X3 (GAUZE/BANDAGES/DRESSINGS) ×2 IMPLANT
DRSG OPSITE 4X5.5 SM (GAUZE/BANDAGES/DRESSINGS) ×2 IMPLANT
DURAPREP 6ML APPLICATOR 50/CS (WOUND CARE) ×2 IMPLANT
ELECT COATED BLADE 2.86 ST (ELECTRODE) ×2 IMPLANT
ELECT REM PT RETURN 9FT ADLT (ELECTROSURGICAL) ×2
ELECTRODE REM PT RTRN 9FT ADLT (ELECTROSURGICAL) ×1 IMPLANT
GAUZE SPONGE 4X4 16PLY XRAY LF (GAUZE/BANDAGES/DRESSINGS) IMPLANT
GLOVE BIO SURGEON STRL SZ8 (GLOVE) ×2 IMPLANT
GOWN BRE IMP SLV AUR LG STRL (GOWN DISPOSABLE) IMPLANT
GOWN BRE IMP SLV AUR XL STRL (GOWN DISPOSABLE) ×2 IMPLANT
GOWN STRL REIN 2XL LVL4 (GOWN DISPOSABLE) IMPLANT
HEAD HALTER (SOFTGOODS) IMPLANT
KIT BASIN OR (CUSTOM PROCEDURE TRAY) ×2 IMPLANT
KIT ROOM TURNOVER OR (KITS) ×2 IMPLANT
NEEDLE HYPO 25X1 1.5 SAFETY (NEEDLE) ×2 IMPLANT
NEEDLE SPNL 20GX3.5 QUINCKE YW (NEEDLE) ×2 IMPLANT
NS IRRIG 1000ML POUR BTL (IV SOLUTION) ×2 IMPLANT
PAD ARMBOARD 7.5X6 YLW CONV (MISCELLANEOUS) ×2 IMPLANT
RUBBERBAND STERILE (MISCELLANEOUS) ×4 IMPLANT
SPONGE INTESTINAL PEANUT (DISPOSABLE) ×2 IMPLANT
SPONGE SURGIFOAM ABS GEL 100 (HEMOSTASIS) ×2 IMPLANT
STRIP CLOSURE SKIN 1/2X4 (GAUZE/BANDAGES/DRESSINGS) ×2 IMPLANT
SYR 20ML ECCENTRIC (SYRINGE) IMPLANT
TOWEL OR 17X24 6PK STRL BLUE (TOWEL DISPOSABLE) ×2 IMPLANT
TOWEL OR 17X26 10 PK STRL BLUE (TOWEL DISPOSABLE) ×2 IMPLANT
TRAP SPECIMEN MUCOUS 40CC (MISCELLANEOUS) IMPLANT
WATER STERILE IRR 1000ML POUR (IV SOLUTION) ×2 IMPLANT

## 2013-03-09 NOTE — Anesthesia Preprocedure Evaluation (Addendum)
Anesthesia Evaluation  Patient identified by MRN, date of birth, ID band Patient awake    Reviewed: Allergy & Precautions, H&P , NPO status , Patient's Chart, lab work & pertinent test results, reviewed documented beta blocker date and time   Airway Mallampati: II TM Distance: >3 FB Neck ROM: full    Dental  (+)    Pulmonary neg pulmonary ROS, Recent URI , Residual Cough,  breath sounds clear to auscultation        Cardiovascular Exercise Tolerance: Good negative cardio ROS  Rhythm:regular Rate:Normal     Neuro/Psych negative neurological ROS  negative psych ROS   GI/Hepatic negative GI ROS, Neg liver ROS,   Endo/Other  negative endocrine ROS  Renal/GU negative Renal ROS  negative genitourinary   Musculoskeletal  (+) Arthritis -, Osteoarthritis,    Abdominal   Peds  Hematology negative hematology ROS (+)   Anesthesia Other Findings See surgeon's H&P  Discussed patient's impressive cough with surgeon. Though she sounds clear to A&P and is afebrile, the cough is concerning in the face of significant cervical surgery.  He will assess and decide.  CF   Reproductive/Obstetrics negative OB ROS                       Anesthesia Physical Anesthesia Plan  ASA: II  Anesthesia Plan: General   Post-op Pain Management:    Induction: Intravenous  Airway Management Planned: Oral ETT  Additional Equipment:   Intra-op Plan:   Post-operative Plan: Extubation in OR  Informed Consent: I have reviewed the patients History and Physical, chart, labs and discussed the procedure including the risks, benefits and alternatives for the proposed anesthesia with the patient or authorized representative who has indicated his/her understanding and acceptance.   Dental Advisory Given and Dental advisory given  Plan Discussed with: CRNA, Surgeon and Anesthesiologist  Anesthesia Plan Comments:       Anesthesia Quick Evaluation

## 2013-03-17 ENCOUNTER — Encounter (HOSPITAL_COMMUNITY)
Admission: RE | Admit: 2013-03-17 | Discharge: 2013-03-17 | Disposition: A | Discharge status: Home / Self Care | Payer: PRIVATE HEALTH INSURANCE | Source: Ambulatory Visit | Attending: Neurological Surgery | Admitting: Neurological Surgery

## 2013-03-22 NOTE — Progress Notes (Signed)
Left message on voicemail informing ms. Cheetham of change in arrival time to 1100 tomorrow morning.

## 2013-03-23 ENCOUNTER — Ambulatory Visit (HOSPITAL_COMMUNITY): Payer: PRIVATE HEALTH INSURANCE

## 2013-03-23 ENCOUNTER — Ambulatory Visit (HOSPITAL_COMMUNITY)
Admission: RE | Admit: 2013-03-23 | Discharge: 2013-03-24 | Disposition: A | Discharge status: Home / Self Care | Payer: PRIVATE HEALTH INSURANCE | Source: Ambulatory Visit | Attending: Neurological Surgery | Admitting: Neurological Surgery

## 2013-03-23 ENCOUNTER — Encounter (HOSPITAL_COMMUNITY): Payer: Self-pay | Admitting: Anesthesiology

## 2013-03-23 ENCOUNTER — Encounter (HOSPITAL_COMMUNITY)
Admission: RE | Disposition: A | Discharge status: Home / Self Care | Payer: Self-pay | Source: Ambulatory Visit | Attending: Neurological Surgery

## 2013-03-23 ENCOUNTER — Ambulatory Visit (HOSPITAL_COMMUNITY): Payer: PRIVATE HEALTH INSURANCE | Admitting: Anesthesiology

## 2013-03-23 ENCOUNTER — Encounter (HOSPITAL_COMMUNITY): Payer: Self-pay | Admitting: *Deleted

## 2013-03-23 DIAGNOSIS — M5020 Other cervical disc displacement, unspecified cervical region: Secondary | ICD-10-CM | POA: Insufficient documentation

## 2013-03-23 DIAGNOSIS — E7800 Pure hypercholesterolemia, unspecified: Secondary | ICD-10-CM | POA: Insufficient documentation

## 2013-03-23 DIAGNOSIS — Z981 Arthrodesis status: Secondary | ICD-10-CM

## 2013-03-23 DIAGNOSIS — M47812 Spondylosis without myelopathy or radiculopathy, cervical region: Secondary | ICD-10-CM | POA: Insufficient documentation

## 2013-03-23 HISTORY — PX: ANTERIOR CERVICAL DECOMP/DISCECTOMY FUSION: SHX1161

## 2013-03-23 SURGERY — ANTERIOR CERVICAL DECOMPRESSION/DISCECTOMY FUSION 3 LEVELS
Anesthesia: General | Site: Neck | Wound class: Clean

## 2013-03-23 MED ORDER — SODIUM CHLORIDE 0.9 % IV SOLN
INTRAVENOUS | Status: AC
  Filled 2013-03-23: qty 500

## 2013-03-23 MED ORDER — LACTATED RINGERS IV SOLN
INTRAVENOUS | Status: DC | PRN
  Administered 2013-03-23: 13:00:00 via INTRAVENOUS

## 2013-03-23 MED ORDER — SENNA 8.6 MG PO TABS
1.0000 | ORAL_TABLET | Freq: Two times a day (BID) | ORAL | Status: DC
  Administered 2013-03-23 – 2013-03-24 (×2): 8.6 mg via ORAL
  Filled 2013-03-23 (×3): qty 1

## 2013-03-23 MED ORDER — ACETAMINOPHEN 650 MG RE SUPP: 650.0000 mg | RECTAL | Status: DC | PRN

## 2013-03-23 MED ORDER — MORPHINE SULFATE 2 MG/ML IJ SOLN
1.0000 mg | INTRAMUSCULAR | Status: DC | PRN
  Administered 2013-03-23: 2 mg via INTRAVENOUS
  Filled 2013-03-23: qty 1

## 2013-03-23 MED ORDER — DEXAMETHASONE 4 MG PO TABS
4.0000 mg | ORAL_TABLET | Freq: Four times a day (QID) | ORAL | Status: DC
  Administered 2013-03-24: 4 mg via ORAL
  Filled 2013-03-23 (×4): qty 1

## 2013-03-23 MED ORDER — THROMBIN 20000 UNITS EX SOLR
CUTANEOUS | Status: DC | PRN
  Administered 2013-03-23: 14:00:00 via TOPICAL

## 2013-03-23 MED ORDER — ROCURONIUM BROMIDE 100 MG/10ML IV SOLN
INTRAVENOUS | Status: DC | PRN
  Administered 2013-03-23: 20 mg via INTRAVENOUS
  Administered 2013-03-23: 50 mg via INTRAVENOUS
  Administered 2013-03-23: 30 mg via INTRAVENOUS

## 2013-03-23 MED ORDER — FENTANYL CITRATE 0.05 MG/ML IJ SOLN
INTRAMUSCULAR | Status: DC | PRN
  Administered 2013-03-23: 100 ug via INTRAVENOUS
  Administered 2013-03-23: 150 ug via INTRAVENOUS

## 2013-03-23 MED ORDER — BACITRACIN 50000 UNITS IM SOLR
INTRAMUSCULAR | Status: AC
  Filled 2013-03-23: qty 1

## 2013-03-23 MED ORDER — HYDROMORPHONE HCL PF 1 MG/ML IJ SOLN: 0.2500 mg | INTRAMUSCULAR | Status: DC | PRN

## 2013-03-23 MED ORDER — HEMOSTATIC AGENTS (NO CHARGE) OPTIME
TOPICAL | Status: DC | PRN
  Administered 2013-03-23: 1 via TOPICAL

## 2013-03-23 MED ORDER — SODIUM CHLORIDE 0.9 % IR SOLN
Status: DC | PRN
  Administered 2013-03-23: 14:00:00

## 2013-03-23 MED ORDER — MENTHOL 3 MG MT LOZG: 1.0000 | LOZENGE | OROMUCOSAL | Status: DC | PRN

## 2013-03-23 MED ORDER — CEFAZOLIN SODIUM-DEXTROSE 2-3 GM-% IV SOLR
INTRAVENOUS | Status: AC
  Administered 2013-03-23: 2 g via INTRAVENOUS
  Filled 2013-03-23: qty 50

## 2013-03-23 MED ORDER — ARTIFICIAL TEARS OP OINT
TOPICAL_OINTMENT | OPHTHALMIC | Status: DC | PRN
  Administered 2013-03-23: 1 via OPHTHALMIC

## 2013-03-23 MED ORDER — OXYCODONE-ACETAMINOPHEN 5-325 MG PO TABS
1.0000 | ORAL_TABLET | ORAL | Status: DC | PRN
  Administered 2013-03-24 (×3): 1 via ORAL
  Filled 2013-03-23: qty 1
  Filled 2013-03-23: qty 2
  Filled 2013-03-23: qty 1

## 2013-03-23 MED ORDER — DEXTROSE 5 % IV SOLN
INTRAVENOUS | Status: DC | PRN
  Administered 2013-03-23 (×2): via INTRAVENOUS

## 2013-03-23 MED ORDER — MIDAZOLAM HCL 5 MG/5ML IJ SOLN
INTRAMUSCULAR | Status: DC | PRN
  Administered 2013-03-23: 2 mg via INTRAVENOUS

## 2013-03-23 MED ORDER — SODIUM CHLORIDE 0.9 % IJ SOLN: 3.0000 mL | INTRAMUSCULAR | Status: DC | PRN

## 2013-03-23 MED ORDER — LACTATED RINGERS IV SOLN
INTRAVENOUS | Status: DC | PRN
  Administered 2013-03-23 (×2): via INTRAVENOUS

## 2013-03-23 MED ORDER — ACETAMINOPHEN 10 MG/ML IV SOLN
INTRAVENOUS | Status: AC
  Filled 2013-03-23: qty 100

## 2013-03-23 MED ORDER — ONDANSETRON HCL 4 MG/2ML IJ SOLN: 4.0000 mg | INTRAMUSCULAR | Status: DC | PRN

## 2013-03-23 MED ORDER — CEFAZOLIN SODIUM 1-5 GM-% IV SOLN
1.0000 g | Freq: Three times a day (TID) | INTRAVENOUS | Status: AC
  Administered 2013-03-23 – 2013-03-24 (×2): 1 g via INTRAVENOUS
  Filled 2013-03-23 (×2): qty 50

## 2013-03-23 MED ORDER — POTASSIUM CHLORIDE IN NACL 20-0.9 MEQ/L-% IV SOLN
INTRAVENOUS | Status: DC
  Filled 2013-03-23 (×2): qty 1000

## 2013-03-23 MED ORDER — PROPOFOL 10 MG/ML IV BOLUS
INTRAVENOUS | Status: DC | PRN
  Administered 2013-03-23: 160 mg via INTRAVENOUS
  Administered 2013-03-23: 40 mg via INTRAVENOUS
  Administered 2013-03-23 (×2): 50 mg via INTRAVENOUS

## 2013-03-23 MED ORDER — PHENOL 1.4 % MT LIQD: 1.0000 | OROMUCOSAL | Status: DC | PRN

## 2013-03-23 MED ORDER — ONDANSETRON HCL 4 MG/2ML IJ SOLN
INTRAMUSCULAR | Status: DC | PRN
  Administered 2013-03-23: 4 mg via INTRAVENOUS

## 2013-03-23 MED ORDER — HYDROMORPHONE HCL PF 1 MG/ML IJ SOLN
INTRAMUSCULAR | Status: AC
  Filled 2013-03-23: qty 1

## 2013-03-23 MED ORDER — NEOSTIGMINE METHYLSULFATE 1 MG/ML IJ SOLN
INTRAMUSCULAR | Status: DC | PRN
  Administered 2013-03-23: 4 mg via INTRAVENOUS

## 2013-03-23 MED ORDER — 0.9 % SODIUM CHLORIDE (POUR BTL) OPTIME
TOPICAL | Status: DC | PRN
  Administered 2013-03-23: 1000 mL

## 2013-03-23 MED ORDER — SODIUM CHLORIDE 0.9 % IJ SOLN
3.0000 mL | Freq: Two times a day (BID) | INTRAMUSCULAR | Status: DC
  Administered 2013-03-23: 3 mL via INTRAVENOUS

## 2013-03-23 MED ORDER — SODIUM CHLORIDE 0.9 % IV SOLN
250.0000 mL | INTRAVENOUS | Status: DC
  Administered 2013-03-23: 250 mL via INTRAVENOUS

## 2013-03-23 MED ORDER — ACETAMINOPHEN 325 MG PO TABS: 650.0000 mg | ORAL_TABLET | ORAL | Status: DC | PRN

## 2013-03-23 MED ORDER — ACETAMINOPHEN 10 MG/ML IV SOLN
1000.0000 mg | Freq: Four times a day (QID) | INTRAVENOUS | Status: DC
  Administered 2013-03-23 – 2013-03-24 (×3): 1000 mg via INTRAVENOUS
  Filled 2013-03-23 (×3): qty 100

## 2013-03-23 MED ORDER — HYDROMORPHONE HCL PF 1 MG/ML IJ SOLN
INTRAMUSCULAR | Status: DC | PRN
  Administered 2013-03-23 (×2): .25 mg via INTRAVENOUS
  Administered 2013-03-23: 0.5 mg via INTRAVENOUS

## 2013-03-23 MED ORDER — DEXAMETHASONE SODIUM PHOSPHATE 4 MG/ML IJ SOLN
4.0000 mg | Freq: Four times a day (QID) | INTRAMUSCULAR | Status: DC
  Administered 2013-03-23 – 2013-03-24 (×2): 4 mg via INTRAVENOUS
  Filled 2013-03-23 (×7): qty 1

## 2013-03-23 MED ORDER — GLYCOPYRROLATE 0.2 MG/ML IJ SOLN
INTRAMUSCULAR | Status: DC | PRN
  Administered 2013-03-23: 0.6 mg via INTRAVENOUS

## 2013-03-23 MED ORDER — CYCLOBENZAPRINE HCL 10 MG PO TABS: 10.0000 mg | ORAL_TABLET | Freq: Three times a day (TID) | ORAL | Status: DC | PRN

## 2013-03-23 MED ORDER — HYDROMORPHONE HCL PF 1 MG/ML IJ SOLN
0.2500 mg | INTRAMUSCULAR | Status: DC | PRN
  Administered 2013-03-23 (×2): 0.5 mg via INTRAVENOUS

## 2013-03-23 MED ORDER — LIDOCAINE HCL (CARDIAC) 20 MG/ML IV SOLN
INTRAVENOUS | Status: DC | PRN
  Administered 2013-03-23: 80 mg via INTRAVENOUS

## 2013-03-23 MED ORDER — THROMBIN 5000 UNITS EX SOLR
CUTANEOUS | Status: DC | PRN
  Administered 2013-03-23: 5000 [IU] via TOPICAL

## 2013-03-23 MED ORDER — ACETAMINOPHEN 10 MG/ML IV SOLN
INTRAVENOUS | Status: DC | PRN
  Administered 2013-03-23: 1000 mg via INTRAVENOUS

## 2013-03-23 SURGICAL SUPPLY — 54 items
ALLOGRAFT LORDOTIC 8X11X14 (Bone Implant) ×2 IMPLANT
ALLOGRAFT LORDOTIC CC 7X11X14 (Bone Implant) ×4 IMPLANT
APL SKNCLS STERI-STRIP NONHPOA (GAUZE/BANDAGES/DRESSINGS) ×1
BAG DECANTER FOR FLEXI CONT (MISCELLANEOUS) ×2 IMPLANT
BENZOIN TINCTURE PRP APPL 2/3 (GAUZE/BANDAGES/DRESSINGS) ×2 IMPLANT
BIT DRILL POWER (BIT) ×1 IMPLANT
BUR MATCHSTICK NEURO 3.0 LAGG (BURR) ×2 IMPLANT
CANISTER SUCTION 2500CC (MISCELLANEOUS) ×2 IMPLANT
CLOTH BEACON ORANGE TIMEOUT ST (SAFETY) ×2 IMPLANT
CONT SPEC 4OZ CLIKSEAL STRL BL (MISCELLANEOUS) ×2 IMPLANT
DRAPE C-ARM 42X72 X-RAY (DRAPES) ×4 IMPLANT
DRAPE LAPAROTOMY 100X72 PEDS (DRAPES) ×2 IMPLANT
DRAPE MICROSCOPE ZEISS OPMI (DRAPES) ×2 IMPLANT
DRAPE POUCH INSTRU U-SHP 10X18 (DRAPES) ×2 IMPLANT
DRESSING TELFA 8X3 (GAUZE/BANDAGES/DRESSINGS) ×2 IMPLANT
DRILL BIT POWER (BIT) ×1
DRSG OPSITE 4X5.5 SM (GAUZE/BANDAGES/DRESSINGS) ×2 IMPLANT
DURAPREP 6ML APPLICATOR 50/CS (WOUND CARE) ×2 IMPLANT
ELECT COATED BLADE 2.86 ST (ELECTRODE) ×2 IMPLANT
ELECT REM PT RETURN 9FT ADLT (ELECTROSURGICAL) ×2
ELECTRODE REM PT RTRN 9FT ADLT (ELECTROSURGICAL) ×1 IMPLANT
EVACUATOR 1/8 PVC DRAIN (DRAIN) ×2 IMPLANT
EVACUATOR SILICONE 100CC (DRAIN) ×2 IMPLANT
GAUZE SPONGE 4X4 16PLY XRAY LF (GAUZE/BANDAGES/DRESSINGS) ×2 IMPLANT
GLOVE BIO SURGEON STRL SZ8 (GLOVE) ×2 IMPLANT
GLOVE BIOGEL PI IND STRL 7.5 (GLOVE) ×1 IMPLANT
GLOVE BIOGEL PI INDICATOR 7.5 (GLOVE) ×1
GLOVE ECLIPSE 7.5 STRL STRAW (GLOVE) ×6 IMPLANT
GLOVE INDICATOR 7.0 STRL GRN (GLOVE) ×2 IMPLANT
GLOVE OPTIFIT SS 6.5 STRL BRWN (GLOVE) ×6 IMPLANT
GOWN BRE IMP SLV AUR LG STRL (GOWN DISPOSABLE) IMPLANT
GOWN BRE IMP SLV AUR XL STRL (GOWN DISPOSABLE) ×6 IMPLANT
GOWN STRL REIN 2XL LVL4 (GOWN DISPOSABLE) IMPLANT
HEAD HALTER (SOFTGOODS) IMPLANT
HEMOSTAT POWDER KIT SURGIFOAM (HEMOSTASIS) ×2 IMPLANT
Helix-T Screw (Screw) ×16 IMPLANT
KIT BASIN OR (CUSTOM PROCEDURE TRAY) ×2 IMPLANT
KIT ROOM TURNOVER OR (KITS) ×2 IMPLANT
NEEDLE HYPO 25X1 1.5 SAFETY (NEEDLE) ×2 IMPLANT
NEEDLE SPNL 20GX3.5 QUINCKE YW (NEEDLE) ×2 IMPLANT
NS IRRIG 1000ML POUR BTL (IV SOLUTION) ×2 IMPLANT
PACK LAMINECTOMY NEURO (CUSTOM PROCEDURE TRAY) ×2 IMPLANT
PAD ARMBOARD 7.5X6 YLW CONV (MISCELLANEOUS) ×2 IMPLANT
PLATE HELIX T 54MM (Plate) ×2 IMPLANT
RUBBERBAND STERILE (MISCELLANEOUS) ×4 IMPLANT
SPONGE INTESTINAL PEANUT (DISPOSABLE) ×2 IMPLANT
SPONGE SURGIFOAM ABS GEL 100 (HEMOSTASIS) ×2 IMPLANT
STRIP CLOSURE SKIN 1/2X4 (GAUZE/BANDAGES/DRESSINGS) ×2 IMPLANT
SUT VIC AB 3-0 SH 8-18 (SUTURE) ×2 IMPLANT
SYR 20ML ECCENTRIC (SYRINGE) IMPLANT
TOWEL OR 17X24 6PK STRL BLUE (TOWEL DISPOSABLE) ×2 IMPLANT
TOWEL OR 17X26 10 PK STRL BLUE (TOWEL DISPOSABLE) ×2 IMPLANT
TRAP SPECIMEN MUCOUS 40CC (MISCELLANEOUS) IMPLANT
WATER STERILE IRR 1000ML POUR (IV SOLUTION) ×2 IMPLANT

## 2013-03-23 NOTE — Progress Notes (Signed)
Dr. Randa Evens states that labs done on 03/02/2013 will be acceptable for this surgery since patient has a negative medical hx.
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
## 2013-03-23 NOTE — Preoperative (Signed)
Beta Blockers   Reason not to administer Beta Blockers:Not Applicable 

## 2013-03-23 NOTE — Op Note (Signed)
03/23/2013  4:59 PM  PATIENT:  Megan Spears  52 y.o. female  PRE-OPERATIVE DIAGNOSIS:  Cervical spondylosis with HNP C4-5 C5-6 C6-7  POST-OPERATIVE DIAGNOSIS:  same  PROCEDURE:  1. Decompressive anterior cervical discectomy C4-5, C5-6, and C6-7 for central canal and nerve root decompression, 2. Anterior cervical arthrodesis C4-5 C5-6 C6-7 utilizing cortico-cancellus allografts, 3. Anterior cervical plating C4-C7 utilizing a helix translational plate  SURGEON:  Marikay Alar, MD  ASSISTANTS: Dr. Gerlene Fee  ANESTHESIA:   General  EBL: 100 ml  Total I/O In: 2050 [I.V.:2050] Out: 150 [Blood:150]  BLOOD ADMINISTERED:none  DRAINS: 7 flat JP   SPECIMEN:  No Specimen  INDICATION FOR PROCEDURE: This patient presented with a long history of neck pain with radiation into the right arm. She had numbness and tingling in the right arm the thumb and index finger. MRI and plain films showed cervical spondylosis with herniated disc at C4-5 and C5-6 on the right C6-7 on the left. she tried medical management question, significant relief. I recommended ACDF with plating ent understood the risks, benefits, and alternatives and potential outcomes and wished to proceed.  PROCEDURE DETAILS:  Patient was brought to the operating room placed under general endotracheal anesthesia. Patient was placed in the supine position on the operating room table. The neck was prepped with Duraprep and draped in a sterile fashion.   Three cc of local anesthesia was injected and a transverse incision was made on the right side of the neck.  Dissection was carried down thru the subcutaneous tissue and the platysma was  elevated, opened, and undermined with Metzenbaum scissors.  Dissection was then carried out thru an avascular plane leaving the sternocleidomastoid carotid artery and jugular vein laterally and the trachea and esophagus medially. The ventral aspect of the vertebral column was identified and a localizing x-ray  was taken. The C4-5 C5-6 and C6-7 level was identified. The longus colli muscles were then elevated and the retractor was placed to expose C4-C7. The annulus was incised at each level and the disc space entered. Discectomy was performed with micro-curettes and pituitary rongeurs. I then used the high-speed drill to drill the endplates down to the level of the posterior longitudinal ligament at each level . The operating microscope was draped and brought into the field provided additional magnification, illumination and visualization. Discectomy was continued posteriorly thru the disc space. Posterior longitudinal ligament was opened with a nerve hook at each level, and then removed along with disc herniation and osteophytes, decompressing the spinal canal and thecal sac. We then continued to remove osteophytic overgrowth and disc material decompressing the neural foramina and exiting nerve roots bilaterally at each level. The exact same decompression was done at all 3 levels. The scope was angled up and down to help decompress and undercut the vertebral bodies. Once the decompression was completed we could pass a nerve hook circumferentially to assure adequate decompression in the midline and in the neural foramina. So by both visualization and palpation we felt we had an adequate decompression of the neural elements. We then measured the height of the intravertebral disc space and selected a 7 or 8 millimeter cortical cancellus allograft. It was then gently positioned in the intravertebral disc space and countersunk at each level. I then used a helix translational plate and placed fixed angle screws into the vertebral bodies of C4-C7 inclusive and locked them into position. The wound was irrigated with bacitracin solution, checked for hemostasis which was established and confirmed. Once meticulous hemostasis was  achieved, we then proceeded with closure. The platysma was closed with interrupted 3-0 undyed Vicryl  suture, the subcuticular layer was closed with interrupted 3-0 undyed Vicryl suture. The skin edges were approximated with steristrips. The drapes were removed. A sterile dressing was applied. The patient was then awakened from general anesthesia and transferred to the recovery room in stable condition. At the end of the procedure all sponge, needle and instrument counts were correct.   PLAN OF CARE: Admit for overnight observation  PATIENT DISPOSITION:  PACU - hemodynamically stable.   Delay start of Pharmacological VTE agent (>24hrs) due to surgical blood loss or risk of bleeding:  yes

## 2013-03-23 NOTE — Transfer of Care (Signed)
Immediate Anesthesia Transfer of Care Note  Patient: Megan Spears  Procedure(s) Performed: Procedure(s) with comments: ANTERIOR CERVICAL DECOMPRESSION/DISCECTOMY FUSION 3 LEVELS (N/A) - Anterior Cervical Four-Seven Decompression and Fusion  Patient Location: PACU  Anesthesia Type:General  Level of Consciousness: awake, alert , oriented and patient cooperative  Airway & Oxygen Therapy: Patient Spontanous Breathing and Patient connected to nasal cannula oxygen  Post-op Assessment: Report given to PACU RN and Post -op Vital signs reviewed and stable  Post vital signs: Reviewed and stable  Complications: No apparent anesthesia complications

## 2013-03-23 NOTE — Anesthesia Procedure Notes (Signed)
Procedure Name: Intubation Date/Time: 03/23/2013 1:53 PM Performed by: Tyrone Nine Pre-anesthesia Checklist: Patient identified, Timeout performed, Emergency Drugs available, Suction available and Patient being monitored Patient Re-evaluated:Patient Re-evaluated prior to inductionOxygen Delivery Method: Circle system utilized Preoxygenation: Pre-oxygenation with 100% oxygen Intubation Type: IV induction Ventilation: Mask ventilation without difficulty Laryngoscope Size: Mac and 4 Grade View: Grade I Tube type: Oral Tube size: 7.5 mm Number of attempts: 1 Airway Equipment and Method: Stylet Placement Confirmation: ETT inserted through vocal cords under direct vision,  positive ETCO2 and breath sounds checked- equal and bilateral Secured at: 23 cm Tube secured with: Tape Dental Injury: Teeth and Oropharynx as per pre-operative assessment

## 2013-03-23 NOTE — Anesthesia Postprocedure Evaluation (Signed)
  Anesthesia Post-op Note  Patient: Megan Spears  Procedure(s) Performed: Procedure(s) with comments: ANTERIOR CERVICAL DECOMPRESSION/DISCECTOMY FUSION 3 LEVELS (N/A) - Anterior Cervical Four-Seven Decompression and Fusion  Patient Location: PACU  Anesthesia Type:General  Level of Consciousness: awake  Airway and Oxygen Therapy: Patient Spontanous Breathing  Post-op Pain: mild  Post-op Assessment: Post-op Vital signs reviewed  Post-op Vital Signs: Reviewed  Complications: No apparent anesthesia complications

## 2013-03-24 MED ORDER — PNEUMOCOCCAL VAC POLYVALENT 25 MCG/0.5ML IJ INJ
0.5000 mL | INJECTION | INTRAMUSCULAR | Status: AC
  Administered 2013-03-24: 0.5 mL via INTRAMUSCULAR
  Filled 2013-03-24: qty 0.5

## 2013-03-24 MED ORDER — CYCLOBENZAPRINE HCL 10 MG PO TABS: 10.0000 mg | ORAL_TABLET | Freq: Three times a day (TID) | ORAL | Status: DC | PRN

## 2013-03-24 MED ORDER — OXYCODONE-ACETAMINOPHEN 5-325 MG PO TABS: 1.0000 | ORAL_TABLET | Freq: Four times a day (QID) | ORAL | Status: DC | PRN

## 2013-03-24 NOTE — Discharge Summary (Signed)
Physician Discharge Summary  Patient ID: Megan Spears MRN: [id number] DOB/AGE: 06/11/1961 52 y.o.  Admit date: 03/23/2013 Discharge date: 03/24/2013  Admission Diagnoses: cervical spondylosis    Discharge Diagnoses: same   Discharged Condition: good  Hospital Course: The patient was admitted on 03/23/2013 and taken to the operating room where the patient underwent ACDF. The patient tolerated the procedure well and was taken to the recovery room and then to the floor in stable condition. The hospital course was routine. There were no complications. The wound remained clean dry and intact. Pt had appropriate neck and throat soreness. No complaints of arm pain or new N/T/W. The patient remained afebrile with stable vital signs, and tolerated a regular diet. The patient continued to increase activities, and pain was well controlled with oral pain medications.   Consults: None  Significant Diagnostic Studies:  Results for orders placed during the hospital encounter of 03/02/13  SURGICAL PCR SCREEN      Result Value Range   MRSA, PCR NEGATIVE  NEGATIVE   Staphylococcus aureus NEGATIVE  NEGATIVE  CBC WITH DIFFERENTIAL      Result Value Range   WBC 6.9  4.0 - 10.5 K/uL   RBC 4.84  3.87 - 5.11 MIL/uL   Hemoglobin 13.5  12.0 - 15.0 g/dL   HCT 40.9  81.1 - 91.4 %   MCV 78.5  78.0 - 100.0 fL   MCH 27.9  26.0 - 34.0 pg   MCHC 35.5  30.0 - 36.0 g/dL   RDW 78.2  95.6 - 21.3 %   Platelets 323  150 - 400 K/uL   Neutrophils Relative 60  43 - 77 %   Neutro Abs 4.1  1.7 - 7.7 K/uL   Lymphocytes Relative 30  12 - 46 %   Lymphs Abs 2.1  0.7 - 4.0 K/uL   Monocytes Relative 8  3 - 12 %   Monocytes Absolute 0.6  0.1 - 1.0 K/uL   Eosinophils Relative 2  0 - 5 %   Eosinophils Absolute 0.1  0.0 - 0.7 K/uL   Basophils Relative 0  0 - 1 %   Basophils Absolute 0.0  0.0 - 0.1 K/uL  BASIC METABOLIC PANEL      Result Value Range   Sodium 140  135 - 145 mEq/L   Potassium 4.2  3.5 - 5.1 mEq/L   Chloride 104  96 - 112 mEq/L   CO2 27  19 - 32 mEq/L   Glucose, Bld 90  70 - 99 mg/dL   BUN 10  6 - 23 mg/dL   Creatinine, Ser 0.86  0.50 - 1.10 mg/dL   Calcium 9.8  8.4 - 57.8 mg/dL   GFR calc non Af Amer >90  >90 mL/min   GFR calc Af Amer >90  >90 mL/min  PROTIME-INR      Result Value Range   Prothrombin Time 12.0  11.6 - 15.2 seconds   INR 0.89  0.00 - 1.49    Dg Cervical Spine 2-3 Views  03/23/2013  *RADIOLOGY REPORT*  Clinical data:  Cervical fusion  CERVICAL SPINE INTRAOPERATIVE  Comparison:  MRI 02/11/2013  Findings:  Three spot images from intraoperative C-arm fluoroscopy document changes of ACDF C4 to C7.  The cervicothoracic junction is not well seen on the lateral images.  IMPRESSION: ACDF C4-C7.   Original Report Authenticated By: D. Andria Rhein, MD    Mm Digital Screening  03/08/2013  *RADIOLOGY REPORT*  Clinical Data: Screening.  DIGITAL BILATERAL  SCREENING MAMMOGRAM WITH CAD  Comparison: None.  FINDINGS:  ACR Breast Density Category 3: The breast tissue is heterogeneously dense.  No suspicious masses, architectural distortion, or calcifications are present.  Images were processed with CAD.  IMPRESSION: No mammographic evidence of malignancy.  A result letter of this screening mammogram will be mailed directly to the patient.  RECOMMENATION: Screening mammogram in one year. (Code:SM-B-01Y)  BI-RADS CATEGORY 1:  Negative.   Original Report Authenticated By: Sherian Rein, M.D.    Dg C-arm 1-60 Min  03/23/2013  *RADIOLOGY REPORT*  Clinical data:  Cervical fusion  CERVICAL SPINE INTRAOPERATIVE  Comparison:  MRI 02/11/2013  Findings:  Three spot images from intraoperative C-arm fluoroscopy document changes of ACDF C4 to C7.  The cervicothoracic junction is not well seen on the lateral images.  IMPRESSION: ACDF C4-C7.   Original Report Authenticated By: D. Andria Rhein, MD     Antibiotics:  Anti-infectives   Start     Dose/Rate Route Frequency Ordered Stop   03/23/13 2045  ceFAZolin  (ANCEF) IVPB 1 g/50 mL premix     1 g 100 mL/hr over 30 Minutes Intravenous Every 8 hours 03/23/13 2033 03/24/13 0705   03/23/13 1334  bacitracin 50,000 Units in sodium chloride irrigation 0.9 % 500 mL irrigation  Status:  Discontinued       As needed 03/23/13 1335 03/23/13 1659   03/23/13 1317  ceFAZolin (ANCEF) 2-3 GM-% IVPB SOLR    Comments:  SAUVE, ROBIN: cabinet override      03/23/13 1317 03/23/13 1355   03/23/13 1306  bacitracin 69629 UNITS injection    Comments:  RATCLIFF, ESTHER: cabinet override      03/23/13 1306 03/24/13 0114      Discharge Exam: Blood pressure 100/69, pulse 58, temperature 97.7 F (36.5 C), temperature source Oral, resp. rate 18, SpO2 95.00%. Neurologic: Grossly normal Incision CDI  Discharge Medications:     Medication List    TAKE these medications       aspirin 81 MG tablet  Take 81 mg by mouth daily.     cyclobenzaprine 10 MG tablet  Commonly known as:  FLEXERIL  Take 1 tablet (10 mg total) by mouth 3 (three) times daily as needed for muscle spasms.     multivitamin with minerals tablet  Take 1 tablet by mouth daily.     oxyCODONE-acetaminophen 5-325 MG per tablet  Commonly known as:  PERCOCET/ROXICET  Take 1-2 tablets by mouth every 6 (six) hours as needed for pain.        Disposition: home   Final Dx: ACDF C4-5, C5-6 ,C6-7      Discharge Orders   Future Orders Complete By Expires     Call MD for:  difficulty breathing, headache or visual disturbances  As directed     Call MD for:  persistant nausea and vomiting  As directed     Call MD for:  redness, tenderness, or signs of infection (pain, swelling, redness, odor or green/yellow discharge around incision site)  As directed     Call MD for:  severe uncontrolled pain  As directed     Call MD for:  temperature >100.4  As directed     Diet - low sodium heart healthy  As directed     Discharge instructions  As directed     Comments:      No driving for 2 weeks, no strenuous  activity, no heavy lifting    Increase activity slowly  As directed  Follow-up Information   Follow up with Jazzmine Kleiman S, MD. Schedule an appointment as soon as possible for a visit in 2 weeks.   Contact information:   [address] [place] [address] [phone]        Signed: Tia Alert 03/24/2013, 10:02 AM

## 2013-03-27 ENCOUNTER — Encounter (HOSPITAL_COMMUNITY): Payer: Self-pay | Admitting: Neurological Surgery

## 2013-09-21 ENCOUNTER — Ambulatory Visit: Payer: PRIVATE HEALTH INSURANCE | Attending: Neurological Surgery | Admitting: Physical Therapy

## 2013-09-21 DIAGNOSIS — M542 Cervicalgia: Secondary | ICD-10-CM | POA: Insufficient documentation

## 2013-09-21 DIAGNOSIS — M545 Low back pain, unspecified: Secondary | ICD-10-CM | POA: Insufficient documentation

## 2013-09-21 DIAGNOSIS — IMO0001 Reserved for inherently not codable concepts without codable children: Secondary | ICD-10-CM | POA: Insufficient documentation

## 2013-09-21 DIAGNOSIS — R293 Abnormal posture: Secondary | ICD-10-CM | POA: Insufficient documentation

## 2013-09-26 ENCOUNTER — Ambulatory Visit: Payer: PRIVATE HEALTH INSURANCE | Admitting: Rehabilitation

## 2013-09-28 ENCOUNTER — Ambulatory Visit: Payer: PRIVATE HEALTH INSURANCE | Attending: Neurological Surgery | Admitting: Rehabilitation

## 2013-09-28 DIAGNOSIS — R293 Abnormal posture: Secondary | ICD-10-CM | POA: Insufficient documentation

## 2013-09-28 DIAGNOSIS — IMO0001 Reserved for inherently not codable concepts without codable children: Secondary | ICD-10-CM | POA: Insufficient documentation

## 2013-09-28 DIAGNOSIS — M542 Cervicalgia: Secondary | ICD-10-CM | POA: Insufficient documentation

## 2013-09-28 DIAGNOSIS — M545 Low back pain, unspecified: Secondary | ICD-10-CM | POA: Insufficient documentation

## 2013-10-03 ENCOUNTER — Ambulatory Visit: Payer: PRIVATE HEALTH INSURANCE | Admitting: Physical Therapy

## 2013-10-05 ENCOUNTER — Encounter: Payer: PRIVATE HEALTH INSURANCE | Admitting: Rehabilitation

## 2013-10-10 ENCOUNTER — Ambulatory Visit: Payer: PRIVATE HEALTH INSURANCE | Admitting: Rehabilitation

## 2013-10-12 ENCOUNTER — Ambulatory Visit: Payer: PRIVATE HEALTH INSURANCE | Admitting: Rehabilitation

## 2013-10-17 ENCOUNTER — Ambulatory Visit: Payer: PRIVATE HEALTH INSURANCE | Admitting: Rehabilitation

## 2013-10-19 ENCOUNTER — Ambulatory Visit: Payer: PRIVATE HEALTH INSURANCE | Admitting: Rehabilitation

## 2013-10-24 ENCOUNTER — Ambulatory Visit: Payer: PRIVATE HEALTH INSURANCE | Admitting: Rehabilitation

## 2013-10-26 ENCOUNTER — Ambulatory Visit: Payer: PRIVATE HEALTH INSURANCE | Admitting: Physical Therapy

## 2013-11-02 ENCOUNTER — Ambulatory Visit: Payer: PRIVATE HEALTH INSURANCE | Attending: Neurological Surgery | Admitting: Physical Therapy

## 2013-11-02 DIAGNOSIS — M542 Cervicalgia: Secondary | ICD-10-CM | POA: Insufficient documentation

## 2013-11-02 DIAGNOSIS — M545 Low back pain, unspecified: Secondary | ICD-10-CM | POA: Insufficient documentation

## 2013-11-02 DIAGNOSIS — IMO0001 Reserved for inherently not codable concepts without codable children: Secondary | ICD-10-CM | POA: Insufficient documentation

## 2013-11-02 DIAGNOSIS — R293 Abnormal posture: Secondary | ICD-10-CM | POA: Insufficient documentation

## 2014-11-06 ENCOUNTER — Other Ambulatory Visit (HOSPITAL_COMMUNITY): Payer: Self-pay | Admitting: Family Medicine

## 2014-11-06 DIAGNOSIS — Z1231 Encounter for screening mammogram for malignant neoplasm of breast: Secondary | ICD-10-CM

## 2014-11-13 ENCOUNTER — Ambulatory Visit (HOSPITAL_COMMUNITY)
Admission: RE | Admit: 2014-11-13 | Discharge: 2014-11-13 | Disposition: A | Discharge status: Home / Self Care | Payer: PRIVATE HEALTH INSURANCE | Source: Ambulatory Visit | Attending: Family Medicine | Admitting: Family Medicine

## 2014-11-13 DIAGNOSIS — Z1231 Encounter for screening mammogram for malignant neoplasm of breast: Secondary | ICD-10-CM | POA: Diagnosis present

## 2015-01-10 ENCOUNTER — Encounter (HOSPITAL_COMMUNITY): Payer: Self-pay | Admitting: Neurological Surgery

## 2015-01-15 ENCOUNTER — Other Ambulatory Visit: Payer: Self-pay | Admitting: Physical Medicine and Rehabilitation

## 2015-01-15 DIAGNOSIS — M5416 Radiculopathy, lumbar region: Secondary | ICD-10-CM

## 2015-01-15 DIAGNOSIS — M545 Low back pain: Secondary | ICD-10-CM

## 2015-01-16 ENCOUNTER — Other Ambulatory Visit: Payer: Self-pay | Admitting: Physical Medicine and Rehabilitation

## 2015-01-28 ENCOUNTER — Ambulatory Visit
Admission: RE | Admit: 2015-01-28 | Discharge: 2015-01-28 | Disposition: A | Discharge status: Home / Self Care | Payer: PRIVATE HEALTH INSURANCE | Source: Ambulatory Visit | Attending: Physical Medicine and Rehabilitation | Admitting: Physical Medicine and Rehabilitation

## 2015-01-28 DIAGNOSIS — M5416 Radiculopathy, lumbar region: Secondary | ICD-10-CM

## 2015-01-28 DIAGNOSIS — M545 Low back pain: Secondary | ICD-10-CM

## 2015-11-01 ENCOUNTER — Other Ambulatory Visit: Payer: Self-pay

## 2015-11-01 DIAGNOSIS — Z1231 Encounter for screening mammogram for malignant neoplasm of breast: Secondary | ICD-10-CM

## 2015-11-27 ENCOUNTER — Ambulatory Visit: Payer: PRIVATE HEALTH INSURANCE

## 2015-12-11 ENCOUNTER — Ambulatory Visit
Admission: RE | Admit: 2015-12-11 | Discharge: 2015-12-11 | Disposition: A | Discharge status: Home / Self Care | Payer: 59 | Source: Ambulatory Visit

## 2015-12-11 DIAGNOSIS — Z1231 Encounter for screening mammogram for malignant neoplasm of breast: Secondary | ICD-10-CM

## 2016-04-28 ENCOUNTER — Other Ambulatory Visit: Payer: Self-pay | Admitting: Orthopaedic Surgery

## 2016-04-28 DIAGNOSIS — M5106 Intervertebral disc disorders with myelopathy, lumbar region: Secondary | ICD-10-CM

## 2016-05-06 ENCOUNTER — Ambulatory Visit
Admission: RE | Admit: 2016-05-06 | Discharge: 2016-05-06 | Disposition: A | Discharge status: Home / Self Care | Payer: 59 | Source: Ambulatory Visit | Attending: Orthopaedic Surgery | Admitting: Orthopaedic Surgery

## 2016-05-06 DIAGNOSIS — M5106 Intervertebral disc disorders with myelopathy, lumbar region: Secondary | ICD-10-CM

## 2017-02-15 ENCOUNTER — Other Ambulatory Visit: Payer: Self-pay | Admitting: Family Medicine

## 2017-02-15 DIAGNOSIS — Z1231 Encounter for screening mammogram for malignant neoplasm of breast: Secondary | ICD-10-CM

## 2017-03-02 ENCOUNTER — Ambulatory Visit: Payer: 59

## 2017-03-08 ENCOUNTER — Ambulatory Visit: Payer: 59

## 2017-03-09 ENCOUNTER — Ambulatory Visit (INDEPENDENT_AMBULATORY_CARE_PROVIDER_SITE_OTHER): Payer: 59

## 2017-03-09 ENCOUNTER — Ambulatory Visit (HOSPITAL_COMMUNITY)
Admission: EM | Admit: 2017-03-09 | Discharge: 2017-03-09 | Disposition: A | Discharge status: Home / Self Care | Payer: 59 | Attending: Internal Medicine | Admitting: Internal Medicine

## 2017-03-09 ENCOUNTER — Encounter (HOSPITAL_COMMUNITY): Payer: Self-pay | Admitting: Family Medicine

## 2017-03-09 DIAGNOSIS — W19XXXA Unspecified fall, initial encounter: Secondary | ICD-10-CM | POA: Diagnosis not present

## 2017-03-09 DIAGNOSIS — M79672 Pain in left foot: Secondary | ICD-10-CM

## 2017-03-09 DIAGNOSIS — S93402A Sprain of unspecified ligament of left ankle, initial encounter: Secondary | ICD-10-CM | POA: Diagnosis not present

## 2017-03-09 MED ORDER — IBUPROFEN 800 MG PO TABS
800.0000 mg | ORAL_TABLET | Freq: Once | ORAL | Status: AC
  Administered 2017-03-09: 800 mg via ORAL

## 2017-03-09 MED ORDER — DICLOFENAC SODIUM 75 MG PO TBEC: 75.0000 mg | DELAYED_RELEASE_TABLET | Freq: Two times a day (BID) | ORAL | 0 refills | Status: DC
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
## 2017-03-09 NOTE — Discharge Instructions (Signed)
You most likely have a sprained ankle, I have sent a prescription to your pharmacy for a medicine called diclofenac. Take 1 tablet twice a day as needed for pain. We have applied  an ASO wrap in clinic and given you crutches.  Should your pain persist past 1 week follow up with an orthopedist or return to clinic.

## 2017-03-09 NOTE — ED Triage Notes (Signed)
Pt here for fall today twisting left ankle and foot. sts also she hurt the left lateral knee area.

## 2017-03-26 ENCOUNTER — Ambulatory Visit
Admission: RE | Admit: 2017-03-26 | Discharge: 2017-03-26 | Disposition: A | Discharge status: Home / Self Care | Payer: 59 | Source: Ambulatory Visit | Attending: Family Medicine | Admitting: Family Medicine

## 2017-03-26 DIAGNOSIS — Z1231 Encounter for screening mammogram for malignant neoplasm of breast: Secondary | ICD-10-CM

## 2017-06-02 ENCOUNTER — Encounter (HOSPITAL_COMMUNITY): Payer: Self-pay | Admitting: Emergency Medicine

## 2017-06-02 ENCOUNTER — Ambulatory Visit (HOSPITAL_COMMUNITY)
Admission: EM | Admit: 2017-06-02 | Discharge: 2017-06-02 | Disposition: A | Discharge status: Home / Self Care | Payer: 59 | Attending: Internal Medicine | Admitting: Internal Medicine

## 2017-06-02 DIAGNOSIS — B029 Zoster without complications: Secondary | ICD-10-CM

## 2017-06-02 MED ORDER — PREDNISONE 10 MG (21) PO TBPK: ORAL_TABLET | ORAL | 0 refills | Status: DC

## 2017-06-02 MED ORDER — ACYCLOVIR 800 MG PO TABS: 800.0000 mg | ORAL_TABLET | Freq: Every day | ORAL | 0 refills | Status: AC

## 2017-06-02 MED ORDER — GABAPENTIN 300 MG PO CAPS: ORAL_CAPSULE | ORAL | 0 refills | Status: AC
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
## 2017-06-02 NOTE — ED Triage Notes (Signed)
Rash on both sides of back, worse on right side of back.  Patient does wear a back brace at work.

## 2017-09-30 ENCOUNTER — Telehealth: Payer: Self-pay

## 2017-09-30 NOTE — Telephone Encounter (Signed)
Notes sent to scheduling.   

## 2017-10-21 ENCOUNTER — Ambulatory Visit (INDEPENDENT_AMBULATORY_CARE_PROVIDER_SITE_OTHER): Payer: 59 | Admitting: Cardiology

## 2017-10-21 ENCOUNTER — Encounter (INDEPENDENT_AMBULATORY_CARE_PROVIDER_SITE_OTHER): Payer: Self-pay

## 2017-10-21 ENCOUNTER — Encounter: Payer: Self-pay | Admitting: Cardiology

## 2017-10-21 VITALS — BP 118/84 | HR 80 | Ht 68.0 in | Wt 210.0 lb

## 2017-10-21 DIAGNOSIS — K219 Gastro-esophageal reflux disease without esophagitis: Secondary | ICD-10-CM | POA: Diagnosis not present

## 2017-10-21 DIAGNOSIS — R079 Chest pain, unspecified: Secondary | ICD-10-CM | POA: Diagnosis not present

## 2017-10-21 DIAGNOSIS — Z720 Tobacco use: Secondary | ICD-10-CM | POA: Insufficient documentation

## 2017-10-21 NOTE — Patient Instructions (Addendum)
Medication Instructions:  Your physician recommends that you continue on your current medications as directed. Please refer to the Current Medication list given to you today.  Labwork: NONE  Testing/Procedures: Your physician has requested that you have an echocardiogram. Echocardiography is a painless test that uses sound waves to create images of your heart. It provides your doctor with information about the size and shape of your heart and how well your heart's chambers and valves are working. This procedure takes approximately one hour. There are no restrictions for this procedure.  Your physician has requested that you have an exercise tolerance test. For further information please visit [email or web address]. Please also follow instruction sheet, as given.  Follow-Up: AS NEEDED    Echocardiogram An echocardiogram, or echocardiography, uses sound waves (ultrasound) to produce an image of your heart. The echocardiogram is simple, painless, obtained within a short period of time, and offers valuable information to your health care provider. The images from an echocardiogram can provide information such as:  Evidence of coronary artery disease (CAD).  Heart size.  Heart muscle function.  Heart valve function.  Aneurysm detection.  Evidence of a past heart attack.  Fluid buildup around the heart.  Heart muscle thickening.  Assess heart valve function.  Tell a health care provider about:  Any allergies you have.  All medicines you are taking, including vitamins, herbs, eye drops, creams, and over-the-counter medicines.  Any problems you or family members have had with anesthetic medicines.  Any blood disorders you have.  Any surgeries you have had.  Any medical conditions you have.  Whether you are pregnant or may be pregnant. What happens before the procedure? No special preparation is needed. Eat and drink normally. What happens during the procedure?  In order  to produce an image of your heart, gel will be applied to your chest and a wand-like tool (transducer) will be moved over your chest. The gel will help transmit the sound waves from the transducer. The sound waves will harmlessly bounce off your heart to allow the heart images to be captured in real-time motion. These images will then be recorded.  You may need an IV to receive a medicine that improves the quality of the pictures. What happens after the procedure? You may return to your normal schedule including diet, activities, and medicines, unless your health care provider tells you otherwise. This information is not intended to replace advice given to you by your health care provider. Make sure you discuss any questions you have with your health care provider. Document Released: 12/11/2000 Document Revised: 08/01/2016 Document Reviewed: 08/21/2013 Elsevier Interactive Patient Education  2017 [employer]
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
## 2017-11-05 ENCOUNTER — Ambulatory Visit (INDEPENDENT_AMBULATORY_CARE_PROVIDER_SITE_OTHER): Payer: 59

## 2017-11-05 ENCOUNTER — Ambulatory Visit (HOSPITAL_COMMUNITY): Payer: 59 | Attending: Cardiology

## 2017-11-05 ENCOUNTER — Other Ambulatory Visit: Payer: Self-pay

## 2017-11-05 DIAGNOSIS — R079 Chest pain, unspecified: Secondary | ICD-10-CM

## 2017-11-05 LAB — EXERCISE TOLERANCE TEST
CHL CUP MPHR: 166 {beats}/min
CSEPED: 6 min
CSEPEDS: 0 s
CSEPHR: 100 %
Estimated workload: 7 METS
Peak HR: 166 {beats}/min
RPE: 15
Rest HR: 78 {beats}/min

## 2017-11-08 ENCOUNTER — Telehealth: Payer: Self-pay | Admitting: Cardiology

## 2017-11-08 DIAGNOSIS — R079 Chest pain, unspecified: Secondary | ICD-10-CM

## 2017-11-08 NOTE — Telephone Encounter (Signed)
Follow up ° ° ° ° ° ° ° ° °Pt calling for test results  °

## 2017-11-08 NOTE — Telephone Encounter (Signed)
Patient returning a call for test results. Informed patient of ECHO and stress test results and she will be referred to GI for further work up. Patient is in agreement with plan and thanked me for the call.

## 2017-11-08 NOTE — Telephone Encounter (Signed)
Patient returning call for ETT results, patient will be available from 12 -12:30pm

## 2017-11-09 ENCOUNTER — Encounter: Payer: Self-pay | Admitting: Gastroenterology

## 2018-01-04 ENCOUNTER — Ambulatory Visit: Payer: 59 | Admitting: Gastroenterology

## 2018-01-10 ENCOUNTER — Encounter: Payer: Self-pay | Admitting: Cardiology

## 2018-04-22 ENCOUNTER — Other Ambulatory Visit: Payer: Self-pay | Admitting: Family Medicine

## 2018-04-22 DIAGNOSIS — Z1231 Encounter for screening mammogram for malignant neoplasm of breast: Secondary | ICD-10-CM

## 2018-05-16 ENCOUNTER — Ambulatory Visit
Admission: RE | Admit: 2018-05-16 | Discharge: 2018-05-16 | Disposition: A | Discharge status: Home / Self Care | Payer: 59 | Source: Ambulatory Visit | Attending: Family Medicine | Admitting: Family Medicine

## 2018-05-16 DIAGNOSIS — Z1231 Encounter for screening mammogram for malignant neoplasm of breast: Secondary | ICD-10-CM

## 2019-06-05 ENCOUNTER — Other Ambulatory Visit: Payer: Self-pay | Admitting: Family Medicine

## 2019-06-05 DIAGNOSIS — Z1231 Encounter for screening mammogram for malignant neoplasm of breast: Secondary | ICD-10-CM

## 2019-07-21 ENCOUNTER — Other Ambulatory Visit: Payer: Self-pay

## 2019-07-21 ENCOUNTER — Ambulatory Visit
Admission: RE | Admit: 2019-07-21 | Discharge: 2019-07-21 | Disposition: A | Discharge status: Home / Self Care | Payer: 59 | Source: Ambulatory Visit | Attending: Family Medicine | Admitting: Family Medicine

## 2019-07-21 DIAGNOSIS — Z1231 Encounter for screening mammogram for malignant neoplasm of breast: Secondary | ICD-10-CM

## 2020-06-17 ENCOUNTER — Other Ambulatory Visit: Payer: Self-pay | Admitting: Family Medicine

## 2020-06-17 DIAGNOSIS — Z1231 Encounter for screening mammogram for malignant neoplasm of breast: Secondary | ICD-10-CM

## 2020-07-22 ENCOUNTER — Ambulatory Visit
Admission: RE | Admit: 2020-07-22 | Discharge: 2020-07-22 | Disposition: A | Discharge status: Home / Self Care | Payer: 59 | Source: Ambulatory Visit | Attending: Family Medicine | Admitting: Family Medicine

## 2020-07-22 ENCOUNTER — Other Ambulatory Visit: Payer: Self-pay

## 2020-07-22 DIAGNOSIS — Z1231 Encounter for screening mammogram for malignant neoplasm of breast: Secondary | ICD-10-CM

## 2021-06-06 ENCOUNTER — Other Ambulatory Visit: Payer: Self-pay | Admitting: Orthopaedic Surgery

## 2021-06-06 DIAGNOSIS — M4726 Other spondylosis with radiculopathy, lumbar region: Secondary | ICD-10-CM

## 2021-06-15 ENCOUNTER — Ambulatory Visit
Admission: RE | Admit: 2021-06-15 | Discharge: 2021-06-15 | Disposition: A | Discharge status: Home / Self Care | Payer: 59 | Source: Ambulatory Visit | Attending: Orthopaedic Surgery | Admitting: Orthopaedic Surgery

## 2021-06-15 ENCOUNTER — Other Ambulatory Visit: Payer: Self-pay

## 2021-06-15 DIAGNOSIS — M4726 Other spondylosis with radiculopathy, lumbar region: Secondary | ICD-10-CM

## 2021-07-08 ENCOUNTER — Other Ambulatory Visit: Payer: Self-pay | Admitting: Family Medicine

## 2021-07-08 DIAGNOSIS — Z1231 Encounter for screening mammogram for malignant neoplasm of breast: Secondary | ICD-10-CM

## 2021-09-02 ENCOUNTER — Ambulatory Visit
Admission: RE | Admit: 2021-09-02 | Discharge: 2021-09-02 | Disposition: A | Discharge status: Home / Self Care | Payer: 59 | Source: Ambulatory Visit | Attending: Family Medicine | Admitting: Family Medicine

## 2021-09-02 ENCOUNTER — Other Ambulatory Visit: Payer: Self-pay

## 2021-09-02 DIAGNOSIS — Z1231 Encounter for screening mammogram for malignant neoplasm of breast: Secondary | ICD-10-CM

## 2021-12-09 ENCOUNTER — Other Ambulatory Visit: Payer: Self-pay | Admitting: Orthopaedic Surgery

## 2021-12-09 DIAGNOSIS — M5416 Radiculopathy, lumbar region: Secondary | ICD-10-CM

## 2021-12-09 DIAGNOSIS — M48062 Spinal stenosis, lumbar region with neurogenic claudication: Secondary | ICD-10-CM

## 2021-12-27 ENCOUNTER — Other Ambulatory Visit: Payer: Self-pay

## 2021-12-27 ENCOUNTER — Ambulatory Visit
Admission: RE | Admit: 2021-12-27 | Discharge: 2021-12-27 | Disposition: A | Discharge status: Home / Self Care | Payer: 59 | Source: Ambulatory Visit | Attending: Orthopaedic Surgery | Admitting: Orthopaedic Surgery

## 2021-12-27 DIAGNOSIS — M48062 Spinal stenosis, lumbar region with neurogenic claudication: Secondary | ICD-10-CM

## 2021-12-27 DIAGNOSIS — M5416 Radiculopathy, lumbar region: Secondary | ICD-10-CM

## 2022-05-22 ENCOUNTER — Ambulatory Visit (HOSPITAL_COMMUNITY)
Admission: EM | Admit: 2022-05-22 | Discharge: 2022-05-22 | Disposition: A | Discharge status: Home / Self Care | Payer: Commercial Managed Care - HMO | Attending: Internal Medicine | Admitting: Internal Medicine

## 2022-05-22 ENCOUNTER — Ambulatory Visit (INDEPENDENT_AMBULATORY_CARE_PROVIDER_SITE_OTHER): Payer: Commercial Managed Care - HMO

## 2022-05-22 ENCOUNTER — Encounter (HOSPITAL_COMMUNITY): Payer: Self-pay

## 2022-05-22 DIAGNOSIS — M25512 Pain in left shoulder: Secondary | ICD-10-CM

## 2022-05-22 MED ORDER — ACETAMINOPHEN 325 MG PO TABS: 975.0000 mg | ORAL_TABLET | Freq: Once | ORAL | Status: DC

## 2022-05-22 MED ORDER — METHOCARBAMOL 500 MG PO TABS: 500.0000 mg | ORAL_TABLET | Freq: Two times a day (BID) | ORAL | 0 refills | Status: AC

## 2022-05-22 MED ORDER — MELOXICAM 7.5 MG PO TABS: 7.5000 mg | ORAL_TABLET | Freq: Every day | ORAL | 0 refills | Status: AC

## 2022-05-22 MED ORDER — MELOXICAM 15 MG PO TABS: 15.0000 mg | ORAL_TABLET | Freq: Every day | ORAL | 1 refills | Status: DC

## 2022-05-22 MED ORDER — ACETAMINOPHEN 325 MG PO TABS
ORAL_TABLET | ORAL | Status: AC
  Filled 2022-05-22: qty 3

## 2022-05-22 NOTE — Discharge Instructions (Addendum)
You were seen in urgent care today for your shoulder pain.  There is no acute bony abnormality to your shoulder per your x-ray.  Take Mobic once daily for inflammation to your shoulder.  You may also take Robaxin muscle relaxer at nighttime for muscle spasm contributing to your shoulder pain.  Continue using heat to your shoulder as this may help relax muscles as well.  If your shoulder pain has not improved in the next 3 days by Monday, May 25, 2022, go to [facility] walk-in clinic for further evaluation of shoulder.  If you develop any new or worsening symptoms or do not improve in the next 2 to 3 days, please return.  If your symptoms are severe, please go to the emergency room.  Follow-up with your primary care provider for further evaluation and management of your symptoms as well as ongoing wellness visits.  I hope you feel better!

## 2022-05-22 NOTE — ED Triage Notes (Signed)
Pt presents with left shoulder pain X 2 days that she believes to be her rotator cuff.
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
## 2022-07-10 DIAGNOSIS — R030 Elevated blood-pressure reading, without diagnosis of hypertension: Secondary | ICD-10-CM | POA: Diagnosis not present

## 2022-07-10 DIAGNOSIS — M129 Arthropathy, unspecified: Secondary | ICD-10-CM | POA: Diagnosis not present

## 2022-07-10 DIAGNOSIS — G8929 Other chronic pain: Secondary | ICD-10-CM | POA: Diagnosis not present

## 2022-07-10 DIAGNOSIS — R0989 Other specified symptoms and signs involving the circulatory and respiratory systems: Secondary | ICD-10-CM | POA: Diagnosis not present

## 2022-07-10 DIAGNOSIS — M5441 Lumbago with sciatica, right side: Secondary | ICD-10-CM | POA: Diagnosis not present

## 2022-07-10 DIAGNOSIS — E559 Vitamin D deficiency, unspecified: Secondary | ICD-10-CM | POA: Diagnosis not present

## 2022-07-10 DIAGNOSIS — M5442 Lumbago with sciatica, left side: Secondary | ICD-10-CM | POA: Diagnosis not present

## 2022-07-10 DIAGNOSIS — Z6834 Body mass index (BMI) 34.0-34.9, adult: Secondary | ICD-10-CM | POA: Diagnosis not present

## 2022-07-10 DIAGNOSIS — R7303 Prediabetes: Secondary | ICD-10-CM | POA: Diagnosis not present

## 2022-07-10 DIAGNOSIS — R5383 Other fatigue: Secondary | ICD-10-CM | POA: Diagnosis not present

## 2022-07-10 DIAGNOSIS — M79605 Pain in left leg: Secondary | ICD-10-CM | POA: Diagnosis not present

## 2022-07-10 DIAGNOSIS — M79604 Pain in right leg: Secondary | ICD-10-CM | POA: Diagnosis not present

## 2022-07-10 DIAGNOSIS — Z79899 Other long term (current) drug therapy: Secondary | ICD-10-CM | POA: Diagnosis not present

## 2022-07-17 DIAGNOSIS — Z981 Arthrodesis status: Secondary | ICD-10-CM | POA: Diagnosis not present

## 2022-07-17 DIAGNOSIS — M961 Postlaminectomy syndrome, not elsewhere classified: Secondary | ICD-10-CM | POA: Diagnosis not present

## 2022-07-17 DIAGNOSIS — M4326 Fusion of spine, lumbar region: Secondary | ICD-10-CM | POA: Diagnosis not present

## 2022-07-17 DIAGNOSIS — M48062 Spinal stenosis, lumbar region with neurogenic claudication: Secondary | ICD-10-CM | POA: Diagnosis not present

## 2022-07-21 DIAGNOSIS — Z01810 Encounter for preprocedural cardiovascular examination: Secondary | ICD-10-CM | POA: Diagnosis not present

## 2022-07-21 DIAGNOSIS — Z01812 Encounter for preprocedural laboratory examination: Secondary | ICD-10-CM | POA: Diagnosis not present

## 2022-07-21 DIAGNOSIS — M48062 Spinal stenosis, lumbar region with neurogenic claudication: Secondary | ICD-10-CM | POA: Diagnosis not present

## 2022-07-27 DIAGNOSIS — M5106 Intervertebral disc disorders with myelopathy, lumbar region: Secondary | ICD-10-CM | POA: Diagnosis not present

## 2022-07-27 DIAGNOSIS — M47816 Spondylosis without myelopathy or radiculopathy, lumbar region: Secondary | ICD-10-CM | POA: Diagnosis not present

## 2022-07-27 DIAGNOSIS — M961 Postlaminectomy syndrome, not elsewhere classified: Secondary | ICD-10-CM | POA: Diagnosis not present

## 2022-07-27 DIAGNOSIS — M4306 Spondylolysis, lumbar region: Secondary | ICD-10-CM | POA: Diagnosis not present

## 2022-07-27 DIAGNOSIS — Z981 Arthrodesis status: Secondary | ICD-10-CM | POA: Diagnosis not present

## 2022-07-27 DIAGNOSIS — M532X6 Spinal instabilities, lumbar region: Secondary | ICD-10-CM | POA: Diagnosis not present

## 2022-07-27 DIAGNOSIS — Z791 Long term (current) use of non-steroidal anti-inflammatories (NSAID): Secondary | ICD-10-CM | POA: Diagnosis not present

## 2022-07-27 DIAGNOSIS — M5416 Radiculopathy, lumbar region: Secondary | ICD-10-CM | POA: Diagnosis not present

## 2022-07-27 DIAGNOSIS — M4326 Fusion of spine, lumbar region: Secondary | ICD-10-CM | POA: Diagnosis not present

## 2022-07-27 DIAGNOSIS — M5116 Intervertebral disc disorders with radiculopathy, lumbar region: Secondary | ICD-10-CM | POA: Diagnosis not present

## 2022-07-27 DIAGNOSIS — M48062 Spinal stenosis, lumbar region with neurogenic claudication: Secondary | ICD-10-CM | POA: Diagnosis not present

## 2022-07-27 DIAGNOSIS — R2689 Other abnormalities of gait and mobility: Secondary | ICD-10-CM | POA: Diagnosis not present

## 2022-07-27 DIAGNOSIS — M4316 Spondylolisthesis, lumbar region: Secondary | ICD-10-CM | POA: Diagnosis not present

## 2022-07-28 DIAGNOSIS — M4326 Fusion of spine, lumbar region: Secondary | ICD-10-CM | POA: Diagnosis not present

## 2022-07-28 DIAGNOSIS — M48062 Spinal stenosis, lumbar region with neurogenic claudication: Secondary | ICD-10-CM | POA: Diagnosis not present

## 2022-07-28 DIAGNOSIS — M5116 Intervertebral disc disorders with radiculopathy, lumbar region: Secondary | ICD-10-CM | POA: Diagnosis not present

## 2022-07-28 DIAGNOSIS — M961 Postlaminectomy syndrome, not elsewhere classified: Secondary | ICD-10-CM | POA: Diagnosis not present

## 2022-07-28 DIAGNOSIS — M4316 Spondylolisthesis, lumbar region: Secondary | ICD-10-CM | POA: Diagnosis not present

## 2022-07-28 DIAGNOSIS — Z981 Arthrodesis status: Secondary | ICD-10-CM | POA: Diagnosis not present

## 2022-07-28 DIAGNOSIS — Z791 Long term (current) use of non-steroidal anti-inflammatories (NSAID): Secondary | ICD-10-CM | POA: Diagnosis not present

## 2022-07-28 DIAGNOSIS — M5106 Intervertebral disc disorders with myelopathy, lumbar region: Secondary | ICD-10-CM | POA: Diagnosis not present

## 2022-07-28 DIAGNOSIS — M532X6 Spinal instabilities, lumbar region: Secondary | ICD-10-CM | POA: Diagnosis not present

## 2022-08-03 ENCOUNTER — Other Ambulatory Visit: Payer: Self-pay | Admitting: Family Medicine

## 2022-08-03 DIAGNOSIS — Z1231 Encounter for screening mammogram for malignant neoplasm of breast: Secondary | ICD-10-CM

## 2022-08-14 DIAGNOSIS — Z79899 Other long term (current) drug therapy: Secondary | ICD-10-CM | POA: Diagnosis not present

## 2022-08-14 DIAGNOSIS — E7800 Pure hypercholesterolemia, unspecified: Secondary | ICD-10-CM | POA: Diagnosis not present

## 2022-08-14 DIAGNOSIS — Z6835 Body mass index (BMI) 35.0-35.9, adult: Secondary | ICD-10-CM | POA: Diagnosis not present

## 2022-08-14 DIAGNOSIS — M5442 Lumbago with sciatica, left side: Secondary | ICD-10-CM | POA: Diagnosis not present

## 2022-08-14 DIAGNOSIS — M5441 Lumbago with sciatica, right side: Secondary | ICD-10-CM | POA: Diagnosis not present

## 2022-08-14 DIAGNOSIS — Z9889 Other specified postprocedural states: Secondary | ICD-10-CM | POA: Diagnosis not present

## 2022-08-14 DIAGNOSIS — R030 Elevated blood-pressure reading, without diagnosis of hypertension: Secondary | ICD-10-CM | POA: Diagnosis not present

## 2022-08-14 DIAGNOSIS — G8929 Other chronic pain: Secondary | ICD-10-CM | POA: Diagnosis not present

## 2022-08-19 DIAGNOSIS — Z79899 Other long term (current) drug therapy: Secondary | ICD-10-CM | POA: Diagnosis not present

## 2022-08-22 ENCOUNTER — Other Ambulatory Visit: Payer: Self-pay

## 2022-08-22 ENCOUNTER — Emergency Department (HOSPITAL_COMMUNITY)
Admission: EM | Admit: 2022-08-22 | Discharge: 2022-08-23 | Disposition: A | Discharge status: Home / Self Care | Payer: Medicare HMO | Attending: Emergency Medicine | Admitting: Emergency Medicine

## 2022-08-22 ENCOUNTER — Encounter (HOSPITAL_COMMUNITY): Payer: Self-pay | Admitting: *Deleted

## 2022-08-22 DIAGNOSIS — K5641 Fecal impaction: Secondary | ICD-10-CM

## 2022-08-22 DIAGNOSIS — M549 Dorsalgia, unspecified: Secondary | ICD-10-CM | POA: Diagnosis not present

## 2022-08-22 DIAGNOSIS — Z7982 Long term (current) use of aspirin: Secondary | ICD-10-CM | POA: Diagnosis not present

## 2022-08-22 DIAGNOSIS — K5900 Constipation, unspecified: Secondary | ICD-10-CM | POA: Diagnosis not present

## 2022-08-22 DIAGNOSIS — R1084 Generalized abdominal pain: Secondary | ICD-10-CM | POA: Diagnosis not present

## 2022-08-22 DIAGNOSIS — T402X5A Adverse effect of other opioids, initial encounter: Secondary | ICD-10-CM

## 2022-08-22 DIAGNOSIS — I700 Atherosclerosis of aorta: Secondary | ICD-10-CM | POA: Diagnosis not present

## 2022-08-22 DIAGNOSIS — R5381 Other malaise: Secondary | ICD-10-CM | POA: Diagnosis not present

## 2022-08-22 DIAGNOSIS — R109 Unspecified abdominal pain: Secondary | ICD-10-CM | POA: Insufficient documentation

## 2022-08-22 DIAGNOSIS — R188 Other ascites: Secondary | ICD-10-CM | POA: Diagnosis not present

## 2022-08-22 DIAGNOSIS — Z9889 Other specified postprocedural states: Secondary | ICD-10-CM | POA: Diagnosis not present

## 2022-08-22 DIAGNOSIS — K5903 Drug induced constipation: Secondary | ICD-10-CM | POA: Diagnosis not present

## 2022-08-22 NOTE — ED Triage Notes (Signed)
Pt states issues with constipation for about 2 days, took ducolax. She has had some liquid stools while being in triage. Pt had back surgery on 7/31, has been taking oxycodone for pain.

## 2022-08-22 NOTE — ED Triage Notes (Signed)
Pt arrives ambulatory via [facility], pt had recent back surgery, is on narcotics for pain. Recently increased. Has had constipation. Took dulcolax without relief, 150/100. Hr 92. Temp 97.9

## 2022-08-23 ENCOUNTER — Emergency Department (HOSPITAL_COMMUNITY): Payer: Medicare HMO

## 2022-08-23 DIAGNOSIS — K5900 Constipation, unspecified: Secondary | ICD-10-CM | POA: Diagnosis not present

## 2022-08-23 DIAGNOSIS — R188 Other ascites: Secondary | ICD-10-CM | POA: Diagnosis not present

## 2022-08-23 DIAGNOSIS — I700 Atherosclerosis of aorta: Secondary | ICD-10-CM | POA: Diagnosis not present

## 2022-08-23 DIAGNOSIS — R109 Unspecified abdominal pain: Secondary | ICD-10-CM | POA: Diagnosis not present

## 2022-08-23 DIAGNOSIS — Z9889 Other specified postprocedural states: Secondary | ICD-10-CM | POA: Diagnosis not present

## 2022-08-23 LAB — CBC WITH DIFFERENTIAL/PLATELET
Abs Immature Granulocytes: 0.03 10*3/uL (ref 0.00–0.07)
Basophils Absolute: 0 10*3/uL (ref 0.0–0.1)
Basophils Relative: 0 %
Eosinophils Absolute: 0.1 10*3/uL (ref 0.0–0.5)
Eosinophils Relative: 1 %
HCT: 36.1 % (ref 36.0–46.0)
Hemoglobin: 12.4 g/dL (ref 12.0–15.0)
Immature Granulocytes: 0 %
Lymphocytes Relative: 21 %
Lymphs Abs: 1.8 10*3/uL (ref 0.7–4.0)
MCH: 27.8 pg (ref 26.0–34.0)
MCHC: 34.3 g/dL (ref 30.0–36.0)
MCV: 80.9 fL (ref 80.0–100.0)
Monocytes Absolute: 0.8 10*3/uL (ref 0.1–1.0)
Monocytes Relative: 9 %
Neutro Abs: 5.7 10*3/uL (ref 1.7–7.7)
Neutrophils Relative %: 69 %
Platelets: 427 10*3/uL — ABNORMAL HIGH (ref 150–400)
RBC: 4.46 MIL/uL (ref 3.87–5.11)
RDW: 13.7 % (ref 11.5–15.5)
WBC: 8.4 10*3/uL (ref 4.0–10.5)
nRBC: 0 % (ref 0.0–0.2)

## 2022-08-23 LAB — BASIC METABOLIC PANEL
Anion gap: 8 (ref 5–15)
BUN: 9 mg/dL (ref 6–20)
CO2: 27 mmol/L (ref 22–32)
Calcium: 9.1 mg/dL (ref 8.9–10.3)
Chloride: 105 mmol/L (ref 98–111)
Creatinine, Ser: 0.74 mg/dL (ref 0.44–1.00)
GFR, Estimated: >60 mL/min (ref >60)
Glucose, Bld: 136 mg/dL — ABNORMAL HIGH (ref 70–99)
Potassium: 3.9 mmol/L (ref 3.5–5.1)
Sodium: 140 mmol/L (ref 135–145)

## 2022-08-23 LAB — POC OCCULT BLOOD, ED: Fecal Occult Bld: POSITIVE — AB

## 2022-08-23 MED ORDER — SODIUM CHLORIDE 0.9 % IV BOLUS
500.0000 mL | Freq: Once | INTRAVENOUS | Status: AC
  Administered 2022-08-23: 500 mL via INTRAVENOUS

## 2022-08-23 MED ORDER — IOHEXOL 300 MG/ML  SOLN
75.0000 mL | Freq: Once | INTRAMUSCULAR | Status: AC | PRN
  Administered 2022-08-23: 75 mL via INTRAVENOUS

## 2022-08-23 MED ORDER — DIPHENHYDRAMINE HCL 50 MG/ML IJ SOLN
12.5000 mg | Freq: Once | INTRAMUSCULAR | Status: AC
  Administered 2022-08-23: 12.5 mg via INTRAVENOUS
  Filled 2022-08-23: qty 1

## 2022-08-23 MED ORDER — KETOROLAC TROMETHAMINE 15 MG/ML IJ SOLN
15.0000 mg | Freq: Once | INTRAMUSCULAR | Status: DC
  Filled 2022-08-23: qty 1

## 2022-08-23 MED ORDER — PROCHLORPERAZINE EDISYLATE 10 MG/2ML IJ SOLN
10.0000 mg | Freq: Once | INTRAMUSCULAR | Status: AC
  Administered 2022-08-23: 10 mg via INTRAVENOUS
  Filled 2022-08-23: qty 2

## 2022-08-23 NOTE — ED Notes (Signed)
Pt ambulatory to bathroom

## 2022-08-23 NOTE — Discharge Instructions (Addendum)
You were found to have a fecal impaction today.  Read the information about preventing constipation after surgery.  Be sure to take a daily stool softener and on Tuesday talk to your surgeon about this visit and see if you need to be on any other bowel regimens.  It is also important for you to take a fiber supplement or increase the fiber in your diet while you are on oxycodone.  Additionally, drink as much water as you can to avoid constipation.  There was some blood in your stool, potentially from your large bowel movements or hemorrhoids.  Your blood counts and you are stable to follow-up with GI outpatient.  Call the office on these discharge papers.    Return with any worsening symptoms, it was a pleasure to meet you and I hope that you feel better!

## 2022-08-23 NOTE — ED Provider Triage Note (Signed)
Emergency Medicine Provider Triage Evaluation Note  JEILYN REZNIK , a 61 y.o. female  was evaluated in triage.  Pt complains of rectal pain, happened after she was straining to have a bowel movement, noted some rectal bleeding, she denies any bowel or urinary constant, states she recently had back surgery last month, no fevers chills or worsening back pain.  She is not on anticoag's, no history of GI bleeds..  Review of Systems  Positive: Rectal pain, bloody stools Negative: Urinary/bowel incontinency  Physical Exam  BP (!) 130/96 (BP Location: Right Arm)   Pulse 91   Temp 98.9 F (37.2 C) (Oral)   Resp 17   SpO2 93%  Gen:   Awake, no distress   Resp:  Normal effort  MSK:   Moves extremities without difficulty  Other:    Medical Decision Making  Medically screening exam initiated at 12:04 AM.  Appropriate orders placed.  Glennette R Lukach was informed that the remainder of the evaluation will be completed by another provider, this initial triage assessment does not replace that evaluation, and the importance of remaining in the ED until their evaluation is complete.  Lab work imaging have been ordered will need further work-up.   Carroll Sage, PA-C 08/23/22 0006

## 2022-08-23 NOTE — ED Provider Notes (Signed)
[facility] EMERGENCY DEPARTMENT Provider Note   CSN: [id number] Arrival date & time: 08/22/22  2313     History  Chief Complaint  Patient presents with   Constipation    Megan Spears is a 61 y.o. female with a past medical history of spinal stenosis status postelective surgery 7/31 presenting today with constipation.  She says that within the last week she has had her Percocet increased from 5-15.  She was not aware she was supposed to take this with any stool softeners.  Says that she normally does not have bowel movements but every 3 to 4 days but she usually does not have any pain or straining but for the past 2 days she has had some discomfort in her rectum.  Says that last night she had severe pain and called 911 because she "thought she was going to die."  Prior to calling 911 she took an unknown amount of Dulcolax.  She says that she thinks it was way too much.  Since she has been in the waiting room she has been having diarrhea and her clothing.  Reports that since the CT scan has been performed she passed many large stool balls, some of the size of an apple through her rectum.  She says some of them are dark.  No history of GI bleed, does take aspirin.  Stopped meloxicam and Goody powders after surgery in July.  Constipation      Home Medications Prior to Admission medications   Medication Sig Start Date End Date Taking? Authorizing Provider  acyclovir (ZOVIRAX) 800 MG tablet Take 1 tablet (800 mg total) by mouth 5 (five) times daily. 06/02/17   Deatra Canter, FNP  aspirin 81 MG tablet Take 81 mg by mouth daily.      [provider]  fluconazole (DIFLUCAN) 150 MG tablet Take 150 mg by mouth daily.    [provider]  gabapentin (NEURONTIN) 300 MG capsule Take one po tid for pain 06/02/17   Deatra Canter, FNP  meloxicam (MOBIC) 7.5 MG tablet Take 1 tablet (7.5 mg total) by mouth daily. 05/22/22   Carlisle Beers, FNP  methocarbamol  (ROBAXIN) 500 MG tablet Take 1 tablet (500 mg total) by mouth 2 (two) times daily. 05/22/22   Carlisle Beers, FNP  Multiple Vitamins-Minerals (MULTIVITAMIN WITH MINERALS) tablet Take 1 tablet by mouth daily.      [provider]  omeprazole (PRILOSEC) 40 MG capsule Take 40 mg by mouth daily.     [provider]  rosuvastatin (CRESTOR) 20 MG tablet Take 20 mg by mouth daily.    [provider]  traZODone (DESYREL) 50 MG tablet Take 50 mg by mouth at bedtime.    [provider]      Allergies    Patient has no known allergies.    Review of Systems   Review of Systems  Gastrointestinal:  Positive for constipation.    Physical Exam Updated Vital Signs BP (!) 137/91 (BP Location: Right Arm)   Pulse 94   Temp 98.9 F (37.2 C) (Oral)   Resp 18   SpO2 100%  Physical Exam Vitals and nursing note reviewed.  Constitutional:      Appearance: Normal appearance.  HENT:     Head: Normocephalic and atraumatic.  Eyes:     General: No scleral icterus.    Conjunctiva/sclera: Conjunctivae normal.  Pulmonary:     Effort: Pulmonary effort is normal. No respiratory distress.  Abdominal:     General: Abdomen is flat.     Palpations: Abdomen is soft. There is no mass.     Tenderness: There is no abdominal tenderness.  Genitourinary:    Rectum: Guaiac result positive.     Comments: Rectal exam performed in presence of RN.  Patient with multiple nonthrombosed and nonbleeding external hemorrhoids.  Stool in rectal vault clay colored with a pink contents.  No palpable stool balls for disimpaction however able to palpate hard and stool in the more proximal rectum. Musculoskeletal:     Comments: Moving lower extremities without concerns.  5 out of 5 strength to plantarflexion.  No sensation deficits.  Skin:    General: Skin is warm and dry.     Findings: No rash.  Neurological:     Mental Status: She is alert.  Psychiatric:        Mood and Affect: Mood  normal.     ED Results / Procedures / Treatments   Labs (all labs ordered are listed, but only abnormal results are displayed) Labs Reviewed  BASIC METABOLIC PANEL - Abnormal; Notable for the following components:      Result Value   Glucose, Bld 136 (*)    All other components within normal limits  CBC WITH DIFFERENTIAL/PLATELET - Abnormal; Notable for the following components:   Platelets 427 (*)    All other components within normal limits  POC OCCULT BLOOD, ED    EKG None  Radiology CT ABDOMEN PELVIS W CONTRAST  Result Date: 08/23/2022 CLINICAL DATA:  61 year old female with recent spine surgery revision. Increased constipation, pain. EXAM: CT ABDOMEN AND PELVIS WITH CONTRAST TECHNIQUE: Multidetector CT imaging of the abdomen and pelvis was performed using the standard protocol following bolus administration of intravenous contrast. RADIATION DOSE REDUCTION: This exam was performed according to the departmental dose-optimization program which includes automated exposure control, adjustment of the mA and/or kV according to patient size and/or use of iterative reconstruction technique. CONTRAST:  6mL OMNIPAQUE IOHEXOL 300 MG/ML  SOLN COMPARISON:  Lumbar MRI 12/27/2021. CT Abdomen and Pelvis 09/30/2012. FINDINGS: Lower chest: Lower lung volumes today. Mild lung base atelectasis and/or scarring has not significantly changed from 2013. No pericardial or pleural effusion. Hepatobiliary: Negative liver and gallbladder. Pancreas: Negative. Spleen: Negative. Adrenals/Urinary Tract: Stable chronic low-density right adrenal adenoma since 2013, benign (no follow-up imaging recommended). Negative left adrenal gland. Kidneys appear nonobstructed with early contrast excretion. Nondilated ureters. Pelvic phleboliths. Mildly distended urinary bladder might be secondary to mass effect from distended rectum. Stomach/Bowel: Stool ball in the rectum with mild perirectal and presacral inflammatory stranding.  Small volume fluid in the upstream sigmoid colon, and throughout the proximal colon. Appendix remains normal on series 3, image 60. There are some diverticula in the descending colon but no other active inflammation. Fluid throughout nondilated small bowel. Decompressed stomach and duodenum. No free air or free fluid. Vascular/Lymphatic: Mild Aortoiliac calcified atherosclerosis. Major arterial structures and the portal venous system remain patent. No lymphadenopathy. Reproductive: Negative. Other: Stool ball in the rectum measures 12.5 cm in length and is associated with mild perirectal and presacral inflammatory stranding. No pelvic free fluid. Musculoskeletal: Chronic L4 through sacral fusion. New L2-L3 posterior and interbody fusion, decompression. No adverse hardware features. No superimposed No acute osseous abnormality identified. IMPRESSION: 1. Fecal impaction with evidence of stercoral colitis and upstream diarrhea. There may also be a degree of associated bladder outlet obstruction. But no other complicating features. 2. Recent L2-L3 fusion and decompression. Chronic L4  through sacral fusion. No adverse features identified. 3. Aortic Atherosclerosis (ICD10-I70.0). Electronically Signed   By: Odessa Fleming M.D.   On: 08/23/2022 07:39   DG Abdomen 1 View  Result Date: 08/23/2022 CLINICAL DATA:  History of constipation, recent back surgery, initial encounter EXAM: ABDOMEN - 1 VIEW COMPARISON:  None Available. FINDINGS: Postsurgical changes are noted in the lumbar spine. Scattered large and small bowel gas is noted. No free air is seen. Air-fluid levels are noted which may be related to a diarrheal state. No definitive obstructive changes are seen. CT may be helpful for further evaluation. IMPRESSION: Air-fluid levels throughout the abdomen without definitive obstruction. CT may be helpful for further evaluation. Electronically Signed   By: Alcide Clever M.D.   On: 08/23/2022 00:53    Procedures Procedures    Medications Ordered in ED Medications  ketorolac (TORADOL) 15 MG/ML injection 15 mg (15 mg Intravenous Not Given 08/23/22 1137)  iohexol (OMNIPAQUE) 300 MG/ML solution 75 mL (75 mLs Intravenous Contrast Given 08/23/22 0731)  sodium chloride 0.9 % bolus 500 mL (500 mLs Intravenous New Bag/Given 08/23/22 1134)  diphenhydrAMINE (BENADRYL) injection 12.5 mg (12.5 mg Intravenous Given 08/23/22 1133)  prochlorperazine (COMPAZINE) injection 10 mg (10 mg Intravenous Given 08/23/22 1133)    ED Course/ Medical Decision Making/ A&P                           Medical Decision Making Amount and/or Complexity of Data Reviewed Radiology: ordered.  Risk Prescription drug management.   This is a 70 who presents to the ED for concern of constipation.  Differential includes but is not limited to bowel obstruction, IBS/IBD, impaction, colorectal cancer.    This is not an exhaustive differential.    Past Medical History / Co-morbidities / Social History: Patient reports that since she has been in the waiting room she has had multiple large bowel movements.  Has had to change her clothes multiple times.  Additionally, she has developed a headache and some tingling down her legs.  Says that she has had the muscle tension and tingling since her surgery but that its been worse since she has been seated outside and having to change herself.  Also complaining of a migraine which is new.   Additional history: I reviewed patient's external history and she had an elective fusion performed due to lumbar spinal stenosis.  No complications of the surgery.   Physical Exam: Pertinent physical exam findings include Normal strength and sensation in bilateral lower extremities.  Low suspicion cord compression or infection from surgical site Multiple external hemorrhoids, nonthrombosed and nonbleeding. Clay colored stool with some pink tinge on rectal.  Lab Tests: I ordered, and personally interpreted labs.  The  pertinent results include: No white count Hemoccult +++   Imaging Studies: I ordered and independently visualized and interpreted CT and I agree with the radiologist that she has a very large stool burden with a noted fecal impaction.   Medications: I ordered medication including headache cocktail and reevaluation of the patient after these medicines showed that the patient improved. I have reviewed the patients home medicines and have made adjustments as needed.  MDM/Disposition: This is a 61 year old female who presented with 2 days worth of constipation.  Currently on increased dose of Percocet after back surgery at the end of July.  Was not taking this with any stool softeners.  Today your CT scan showed a large fecal impaction.  Prior to  my evaluation she reports having frequent and large solid bowel movements.  Says that some are the size of an apple.  She has had multiple episodes of this since being roomed in the ED.  There is no stool burden to be disimpacted on my exam.  Her stool was guaiac positive however I suspect this could be secondary to straining, passing large and solid bowel movements or hemorrhoids.  Her blood counts are stable.  She is not on any anticoagulation and is avoiding NSAIDs due to recent surgery.  We engaged in shared decision-making and patient reported that she wanted to go home.  Says that her back is in a lot of pain from being here so long and being on the stretcher.  We discussed important return precautions for GI bleeding as well as constipation.  She continues to complain of a headache and we will finish her fluids, Compazine and Benadryl and discharge her home.  She is agreeable to this plan as is her left one on the phone.  She is ambulating in the department to and from the restroom and is stable for discharge.      I discussed this case with my attending physician Dr. Rodena Medin who cosigned this note including patient's presenting symptoms, physical exam,  and planned diagnostics and interventions. Attending physician stated agreement with plan or made changes to plan which were implemented.      Final Clinical Impression(s) / ED Diagnoses Final diagnoses:  Constipation due to opioid therapy  Fecal impaction (HCC)    Rx / DC Orders ED Discharge Orders     None      Results and diagnoses were explained to the patient. Return precautions discussed in full. Patient had no additional questions and expressed complete understanding.   This chart was dictated using voice recognition software.  Despite best efforts to proofread,  errors can occur which can change the documentation meaning.    Woodroe Chen 08/23/22 1258    Wynetta Fines, MD 08/24/22 [phone]

## 2022-08-25 DIAGNOSIS — M545 Low back pain, unspecified: Secondary | ICD-10-CM | POA: Diagnosis not present

## 2022-08-25 DIAGNOSIS — G894 Chronic pain syndrome: Secondary | ICD-10-CM | POA: Diagnosis not present

## 2022-08-25 DIAGNOSIS — Z79891 Long term (current) use of opiate analgesic: Secondary | ICD-10-CM | POA: Diagnosis not present

## 2022-08-25 DIAGNOSIS — Z79899 Other long term (current) drug therapy: Secondary | ICD-10-CM | POA: Diagnosis not present

## 2022-08-26 DIAGNOSIS — E559 Vitamin D deficiency, unspecified: Secondary | ICD-10-CM | POA: Diagnosis not present

## 2022-08-26 DIAGNOSIS — K649 Unspecified hemorrhoids: Secondary | ICD-10-CM | POA: Diagnosis not present

## 2022-08-26 DIAGNOSIS — K219 Gastro-esophageal reflux disease without esophagitis: Secondary | ICD-10-CM | POA: Diagnosis not present

## 2022-08-26 DIAGNOSIS — E7800 Pure hypercholesterolemia, unspecified: Secondary | ICD-10-CM | POA: Diagnosis not present

## 2022-08-26 DIAGNOSIS — M5442 Lumbago with sciatica, left side: Secondary | ICD-10-CM | POA: Diagnosis not present

## 2022-08-26 DIAGNOSIS — M5441 Lumbago with sciatica, right side: Secondary | ICD-10-CM | POA: Diagnosis not present

## 2022-08-26 DIAGNOSIS — F5101 Primary insomnia: Secondary | ICD-10-CM | POA: Diagnosis not present

## 2022-08-26 DIAGNOSIS — G8929 Other chronic pain: Secondary | ICD-10-CM | POA: Diagnosis not present

## 2022-09-03 ENCOUNTER — Ambulatory Visit
Admission: RE | Admit: 2022-09-03 | Discharge: 2022-09-03 | Disposition: A | Discharge status: Home / Self Care | Payer: Medicare HMO | Source: Ambulatory Visit | Attending: Family Medicine | Admitting: Family Medicine

## 2022-09-03 DIAGNOSIS — Z1231 Encounter for screening mammogram for malignant neoplasm of breast: Secondary | ICD-10-CM | POA: Diagnosis not present

## 2022-09-14 DIAGNOSIS — H5203 Hypermetropia, bilateral: Secondary | ICD-10-CM | POA: Diagnosis not present

## 2022-09-14 DIAGNOSIS — Z135 Encounter for screening for eye and ear disorders: Secondary | ICD-10-CM | POA: Diagnosis not present

## 2022-09-14 DIAGNOSIS — H52223 Regular astigmatism, bilateral: Secondary | ICD-10-CM | POA: Diagnosis not present

## 2022-09-14 DIAGNOSIS — H524 Presbyopia: Secondary | ICD-10-CM | POA: Diagnosis not present

## 2022-09-15 DIAGNOSIS — H524 Presbyopia: Secondary | ICD-10-CM | POA: Diagnosis not present

## 2022-09-15 DIAGNOSIS — H52223 Regular astigmatism, bilateral: Secondary | ICD-10-CM | POA: Diagnosis not present

## 2022-09-22 DIAGNOSIS — M4326 Fusion of spine, lumbar region: Secondary | ICD-10-CM | POA: Diagnosis not present
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
## 2022-11-16 ENCOUNTER — Encounter: Payer: Self-pay | Admitting: Primary Care

## 2022-11-16 ENCOUNTER — Ambulatory Visit (INDEPENDENT_AMBULATORY_CARE_PROVIDER_SITE_OTHER): Payer: Medicare Other | Admitting: Primary Care

## 2022-11-16 VITALS — BP 132/84 | HR 73 | Temp 98.1°F | Ht 67.5 in | Wt 242.0 lb

## 2022-11-16 DIAGNOSIS — R0683 Snoring: Secondary | ICD-10-CM

## 2022-11-16 DIAGNOSIS — F5101 Primary insomnia: Secondary | ICD-10-CM

## 2022-11-16 DIAGNOSIS — G4700 Insomnia, unspecified: Secondary | ICD-10-CM | POA: Insufficient documentation

## 2022-11-16 NOTE — Assessment & Plan Note (Signed)
-   Patient has symptoms of loud snoring and sleep disruption.  She takes Ambien for insomnia however still wakes up frequently throughout the night.  BMI 37.  Epworth score 2.  Concern patient has underlying obstructive sleep apnea, she needs home sleep study for evaluation.  We discussed risk of untreated sleep apnea including cardiac arrhythmias, pulmonary hypertension, stroke and diabetes.  We also reviewed treatment options including weight loss, oral appliance, CPAP therapy or referral to ENT for possible surgical options.  Encourage weight loss efforts.  Advised patient against alcohol use with sleep aids.  Caution against if experiencing daytime sleepiness.  Patient to follow-up 1 to 2 weeks after sleep study to review results and treatment options if needed.

## 2022-11-16 NOTE — Assessment & Plan Note (Addendum)
-   She can not sleep without taking sleep aid. Chronic pain and possibility of OSA contributing to sleep disruption. She is currently on 10 mg of Ambien prescribed by Dr. Dolores Frame. She has no residual daytime sleepiness or grogginess. She may benefit from CBT as well for insomnia, we will discuss referral at follow-up after sleep study.

## 2022-11-16 NOTE — Patient Instructions (Addendum)
Sleep apnea is defined as period of 10 seconds or longer when you stop breathing at night. This can happen multiple times a night. Dx sleep apnea is when this occurs more than 5 times an hour.    Mild OSA 5-15 apneic events an hour Moderate OSA 15-30 apneic events an hour Severe OSA > 30 apneic events an hour   Untreated sleep apnea puts you at higher risk for cardiac arrhythmias, pulmonary HTN, stroke and diabetes  Treatment options include weight loss, side sleeping position, oral appliance, CPAP therapy or referral to ENT for possible surgical options    Recommendations: Focus on side sleeping position or elevate head with wedge pillow 30 degrees Work on weight loss efforts if able  Do not drive if experiencing excessive daytime sleepiness of fatigue    Orders: Home sleep study re: loud snoring (ordered)   Follow-up: Please call to schedule follow-up 1-2 weeks after completing home sleep study to review results and treatment if needed (can be virtual)   Sleep Apnea Sleep apnea affects breathing during sleep. It causes breathing to stop for 10 seconds or more, or to become shallow. People with sleep apnea usually snore loudly. It can also increase the risk of: Heart attack. Stroke. Being very overweight (obese). Diabetes. Heart failure. Irregular heartbeat. High blood pressure. The goal of treatment is to help you breathe normally again. What are the causes?  The most common cause of this condition is a collapsed or blocked airway. There are three kinds of sleep apnea: Obstructive sleep apnea. This is caused by a blocked or collapsed airway. Central sleep apnea. This happens when the brain does not send the right signals to the muscles that control breathing. Mixed sleep apnea. This is a combination of obstructive and central sleep apnea. What increases the risk? Being overweight. Smoking. Having a small airway. Being older. Being female. Drinking alcohol. Taking  medicines to calm yourself (sedatives or tranquilizers). Having family members with the condition. Having a tongue or tonsils that are larger than normal. What are the signs or symptoms? Trouble staying asleep. Loud snoring. Headaches in the morning. Waking up gasping. Dry mouth or sore throat in the morning. Being sleepy or tired during the day. If you are sleepy or tired during the day, you may also: Not be able to focus your mind (concentrate). Forget things. Get angry a lot and have mood swings. Feel sad (depressed). Have changes in your personality. Have less interest in sex, if you are female. Be unable to have an erection, if you are female. How is this treated?  Sleeping on your side. Using a medicine to get rid of mucus in your nose (decongestant). Avoiding the use of alcohol, medicines to help you relax, or certain pain medicines (narcotics). Losing weight, if needed. Changing your diet. Quitting smoking. Using a machine to open your airway while you sleep, such as: An oral appliance. This is a mouthpiece that shifts your lower jaw forward. A CPAP device. This device blows air through a mask when you breathe out (exhale). An EPAP device. This has valves that you put in each nostril. A BIPAP device. This device blows air through a mask when you breathe in (inhale) and breathe out. Having surgery if other treatments do not work. Follow these instructions at home: Lifestyle Make changes that your doctor recommends. Eat a healthy diet. Lose weight if needed. Avoid alcohol, medicines to help you relax, and some pain medicines. Do not smoke or use any products that   contain nicotine or tobacco. If you need help quitting, ask your doctor. General instructions Take over-the-counter and prescription medicines only as told by your doctor. If you were given a machine to use while you sleep, use it only as told by your doctor. If you are having surgery, make sure to tell your  doctor you have sleep apnea. You may need to bring your device with you. Keep all follow-up visits. Contact a doctor if: The machine that you were given to use during sleep bothers you or does not seem to be working. You do not get better. You get worse. Get help right away if: Your chest hurts. You have trouble breathing in enough air. You have an uncomfortable feeling in your back, arms, or stomach. You have trouble talking. One side of your body feels weak. A part of your face is hanging down. These symptoms may be an emergency. Get help right away. Call your local emergency services (911 in the [place] Do not wait to see if the symptoms will go away. Do not drive yourself to the hospital. Summary This condition affects breathing during sleep. The most common cause is a collapsed or blocked airway. The goal of treatment is to help you breathe normally while you sleep. This information is not intended to replace advice given to you by your health care provider. Make sure you discuss any questions you have with your health care provider. Document Revised: 07/23/2021 Document Reviewed: 11/22/2020 Elsevier Patient Education  2023 Elsevier Inc.   

## 2022-11-17 ENCOUNTER — Encounter: Payer: Self-pay | Admitting: Gastroenterology

## 2022-11-17 NOTE — Progress Notes (Signed)
Reviewed and agree with assessment/plan.   Teliah Buffalo, MD [facility] 11/17/2022, 1:31 PM Pager:  [phone]  

## 2022-11-18 ENCOUNTER — Ambulatory Visit: Payer: Medicare Other | Admitting: Physician Assistant

## 2022-12-29 ENCOUNTER — Ambulatory Visit (INDEPENDENT_AMBULATORY_CARE_PROVIDER_SITE_OTHER): Payer: Medicare HMO | Admitting: Gastroenterology

## 2022-12-29 ENCOUNTER — Encounter: Payer: Self-pay | Admitting: Gastroenterology

## 2022-12-29 VITALS — BP 118/80 | HR 85 | Ht 67.0 in | Wt 238.0 lb

## 2022-12-29 DIAGNOSIS — K5900 Constipation, unspecified: Secondary | ICD-10-CM

## 2022-12-29 DIAGNOSIS — R195 Other fecal abnormalities: Secondary | ICD-10-CM
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
## 2022-12-29 NOTE — Patient Instructions (Addendum)
If you are age 60 or older, your body mass index should be between 23-30. Your Body mass index is 37.28 kg/m. If this is out of the aforementioned range listed, please consider follow up with your Primary Care Provider.  If you are age 52 or younger, your body mass index should be between 19-25. Your Body mass index is 37.28 kg/m. If this is out of the aformentioned range listed, please consider follow up with your Primary Care Provider.   Continue daily stool softener and Senna 8.6 mg two tablets as needed  if no Bowel movement in 3 days.  The [facility] providers would like to encourage you to use [facility] to communicate with providers for non-urgent requests or questions.  Due to long hold times on the telephone, sending your provider a message by [facility] may be a faster and more efficient way to get a response.  Please allow 48 business hours for a response.  Please remember that this is for non-urgent requests.   It was a pleasure to see you today!  Thank you for trusting me with your gastrointestinal care!    Scott E.Candis Schatz, MD

## 2023-01-06 ENCOUNTER — Ambulatory Visit: Payer: Medicare HMO

## 2023-01-06 DIAGNOSIS — R0683 Snoring: Secondary | ICD-10-CM

## 2023-01-06 DIAGNOSIS — G4710 Hypersomnia, unspecified: Secondary | ICD-10-CM

## 2023-01-07 ENCOUNTER — Telehealth: Payer: Self-pay | Admitting: Primary Care

## 2023-01-07 ENCOUNTER — Telehealth: Payer: Self-pay | Admitting: Critical Care Medicine

## 2023-01-07 ENCOUNTER — Telehealth: Payer: Self-pay | Admitting: Pulmonary Disease

## 2023-01-07 NOTE — Telephone Encounter (Signed)
Called by [facility] who was confused about what to to with sleep study machine now that she is awake for the day. She was instructed to turn the sleep study machine off and return it as she was instructed.and call the PCCM office when it opened if she needed any further instructions.

## 2023-01-07 NOTE — Telephone Encounter (Signed)
Pt aware that we will let her know if somethings wrong with it

## 2023-01-13 NOTE — Telephone Encounter (Signed)
Pt aware again that we did download the study and the provider will have to let us know if its a good one or not

## 2023-01-13 NOTE — Telephone Encounter (Signed)
Patient would like a call back to confirm that HST was okay. Please advise

## 2023-01-20 ENCOUNTER — Ambulatory Visit: Payer: Medicare HMO | Admitting: Physical Therapy

## 2023-01-21 ENCOUNTER — Telehealth: Payer: Self-pay | Admitting: *Deleted

## 2023-01-21 NOTE — Telephone Encounter (Signed)
Spoke with pt. Regarding follow up ov for tomorrow with Geraldo Pitter, NP. Home sleep study has not been read yet. I informed her we would cancel her appt. For tomorrow and she needs to call back in 7-10 days to see if it has been completed. She stated she was at another MD appt. Right now and will call our office to reschedule this appt.

## 2023-01-22 ENCOUNTER — Ambulatory Visit: Payer: Medicare HMO | Admitting: Primary Care

## 2023-01-26 ENCOUNTER — Telehealth: Payer: Self-pay | Admitting: Pulmonary Disease

## 2023-01-26 DIAGNOSIS — G4710 Hypersomnia, unspecified: Secondary | ICD-10-CM

## 2023-01-26 NOTE — Telephone Encounter (Signed)
Call patient  Sleep study result  Date of study: 01/06/2023  Impression: Negative for significant obstructive sleep apnea Mild oxygen desaturations  Recommendation:  Further evaluation and management of insomnia  Optimize sleep hygiene as able  May consider in lab study if there is significant ongoing concerns for obstructive sleep apnea  Schedule for follow-up with provider

## 2023-01-27 NOTE — Telephone Encounter (Signed)
Has appt to review 02/02/23
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
## 2023-02-02 ENCOUNTER — Encounter: Payer: Self-pay | Admitting: Primary Care

## 2023-02-02 ENCOUNTER — Ambulatory Visit: Payer: Medicare HMO | Admitting: Primary Care

## 2023-02-02 VITALS — BP 110/90 | HR 79 | Temp 98.0°F | Ht 67.5 in | Wt 232.6 lb

## 2023-02-02 DIAGNOSIS — F5101 Primary insomnia: Secondary | ICD-10-CM | POA: Diagnosis not present

## 2023-02-02 DIAGNOSIS — R0683 Snoring: Secondary | ICD-10-CM | POA: Diagnosis not present

## 2023-02-02 NOTE — Patient Instructions (Addendum)
Home study did not show any significant evidence of obstructive sleep apnea, this can be falsely negative in around 25-30% patients  Due to your symptoms of snoring and restless sleep would recommend repeating sleep study in lab to ensure that you do not have obstructive sleep apnea  Chronic pain likely contributing to disrupted sleep  Continue Ambien 10mg  at bedtime  Follow-up: 2-3 months with [facility] NP or sooner if needed

## 2023-02-02 NOTE — Assessment & Plan Note (Addendum)
-  Patient has difficulty initiating and maintaining sleep. She can not sleep without taking sleep aid. Currently taking Ambien 10mg  at bedtime. Chronic pain likely contributing to insomnia. She has no significant residual daytime sleepiness or grogginess. May benefit from CBT for insomnia after sleep study.

## 2023-02-02 NOTE — Assessment & Plan Note (Signed)
-   Patient has symptoms of loud snoring and disrupted sleep. HST on 01/06/23 was negative for sleep apnea, average AHI 3.4/hour. Approximately 25-30% home sleep test can be falsely negative. Clinical suspicions remains moderate for dx of sleep apnea, recommend repeating sleep study in lab with polysomnography.

## 2023-02-03 ENCOUNTER — Telehealth: Payer: Self-pay | Admitting: Primary Care

## 2023-02-03 NOTE — Telephone Encounter (Signed)
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   Nothing in notes indicates whether patient is currently working or is on disability.  I called patient and left voice message for her to call me back.  Need this information before the form can be completed.

## 2023-02-03 NOTE — Telephone Encounter (Signed)
Patient called front desk at around 4:00p today - I returned her call and left a voice message to have her call me directly at my desk number.

## 2023-02-03 NOTE — Telephone Encounter (Signed)
Encounter opened in error

## 2023-02-09 ENCOUNTER — Telehealth: Payer: Self-pay

## 2023-02-28 ENCOUNTER — Ambulatory Visit (INDEPENDENT_AMBULATORY_CARE_PROVIDER_SITE_OTHER): Payer: Medicare HMO

## 2023-02-28 ENCOUNTER — Ambulatory Visit
Admission: RE | Admit: 2023-02-28 | Discharge: 2023-02-28 | Disposition: A | Discharge status: Home / Self Care | Payer: Medicare HMO | Source: Ambulatory Visit

## 2023-02-28 VITALS — BP 140/97 | HR 78 | Temp 98.5°F | Resp 20

## 2023-02-28 DIAGNOSIS — R079 Chest pain, unspecified: Secondary | ICD-10-CM | POA: Diagnosis not present

## 2023-02-28 DIAGNOSIS — R0602 Shortness of breath: Secondary | ICD-10-CM

## 2023-02-28 DIAGNOSIS — R0789 Other chest pain: Secondary | ICD-10-CM | POA: Diagnosis not present

## 2023-02-28 DIAGNOSIS — R42 Dizziness and giddiness: Secondary | ICD-10-CM

## 2023-02-28 DIAGNOSIS — H538 Other visual disturbances: Secondary | ICD-10-CM

## 2023-02-28 LAB — POCT URINALYSIS DIP (MANUAL ENTRY)
Bilirubin, UA: NEGATIVE
Glucose, UA: NEGATIVE mg/dL
Ketones, POC UA: NEGATIVE mg/dL
Leukocytes, UA: NEGATIVE
Nitrite, UA: NEGATIVE
Protein Ur, POC: NEGATIVE mg/dL
Spec Grav, UA: 1.02 (ref 1.010–1.025)
Urobilinogen, UA: 0.2 E.U./dL
pH, UA: 5.5 (ref 5.0–8.0)

## 2023-02-28 LAB — POCT FASTING CBG KUC MANUAL ENTRY: POCT Glucose (KUC): 120 mg/dL — AB (ref 70–99)

## 2023-02-28 MED ORDER — MECLIZINE HCL 12.5 MG PO TABS: 12.5000 mg | ORAL_TABLET | Freq: Three times a day (TID) | ORAL | 0 refills | Status: AC | PRN

## 2023-02-28 NOTE — ED Triage Notes (Signed)
Pt c/o intermittent episodes of dizziness, blurred vision and unsteady gait x 2 weeks-NAD-steady gait
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
## 2023-03-03 NOTE — Telephone Encounter (Signed)
Spoke to patient today about long-term disability form letter we received form [employer] in Jan 2024.   The question is can the patient perform the demands of sedentary work (mostly sitting, standing or walking, light lifting) for any time at all.  Megan Spears said she has been out of work for 2 1/2 years and is drawing disability.   She cannot perform any of this type of work due to her back pain.  And she has recently developed vertigo.  She has an appointment with her PCP on 3/7.  I provided the letter to Derl Barrow, NP to complete and sign.

## 2023-03-10 NOTE — Telephone Encounter (Signed)
g

## 2023-03-11 NOTE — Telephone Encounter (Signed)
Derl Barrow, NP reviewed the disability letter regarding patient's ability to perform a sedentary job and marked "no opinion"   The signed letter and copies of patient's ov notes and sleep study were faxed to [place] - fax# [phone].  Hard copy was mailed to the patient.

## 2023-03-27 IMAGING — DX DG SHOULDER 2+V*L*
3 series · 3 of 3 positions shown · non-contrast
Comparison: None Available.

CLINICAL DATA: Left shoulder pain

EXAM:
LEFT SHOULDER - 2+ VIEW

[shoulder ap]
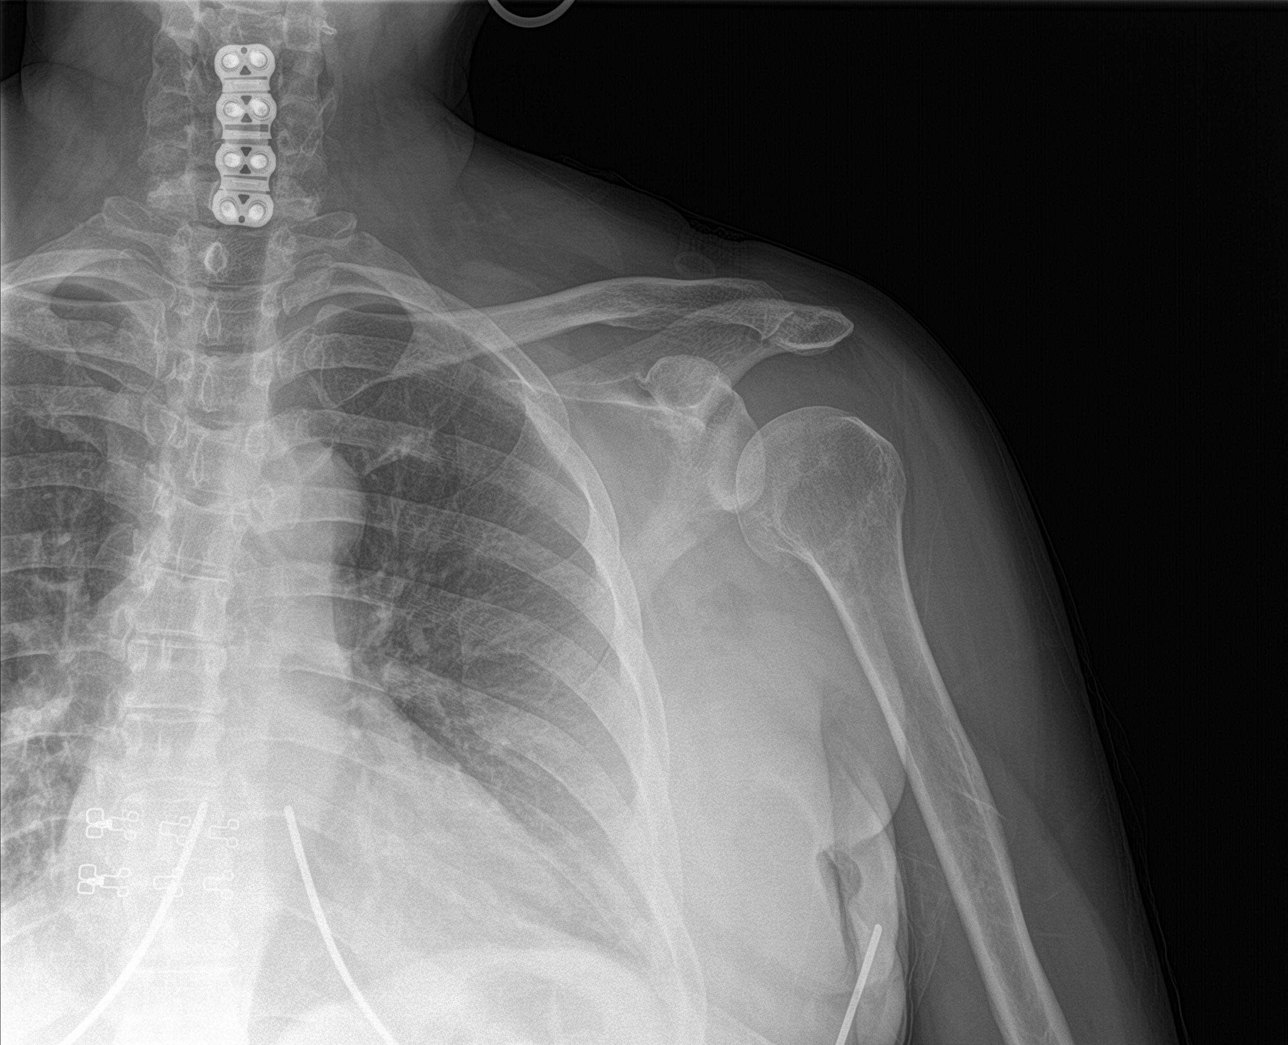

[shoulder grashey]
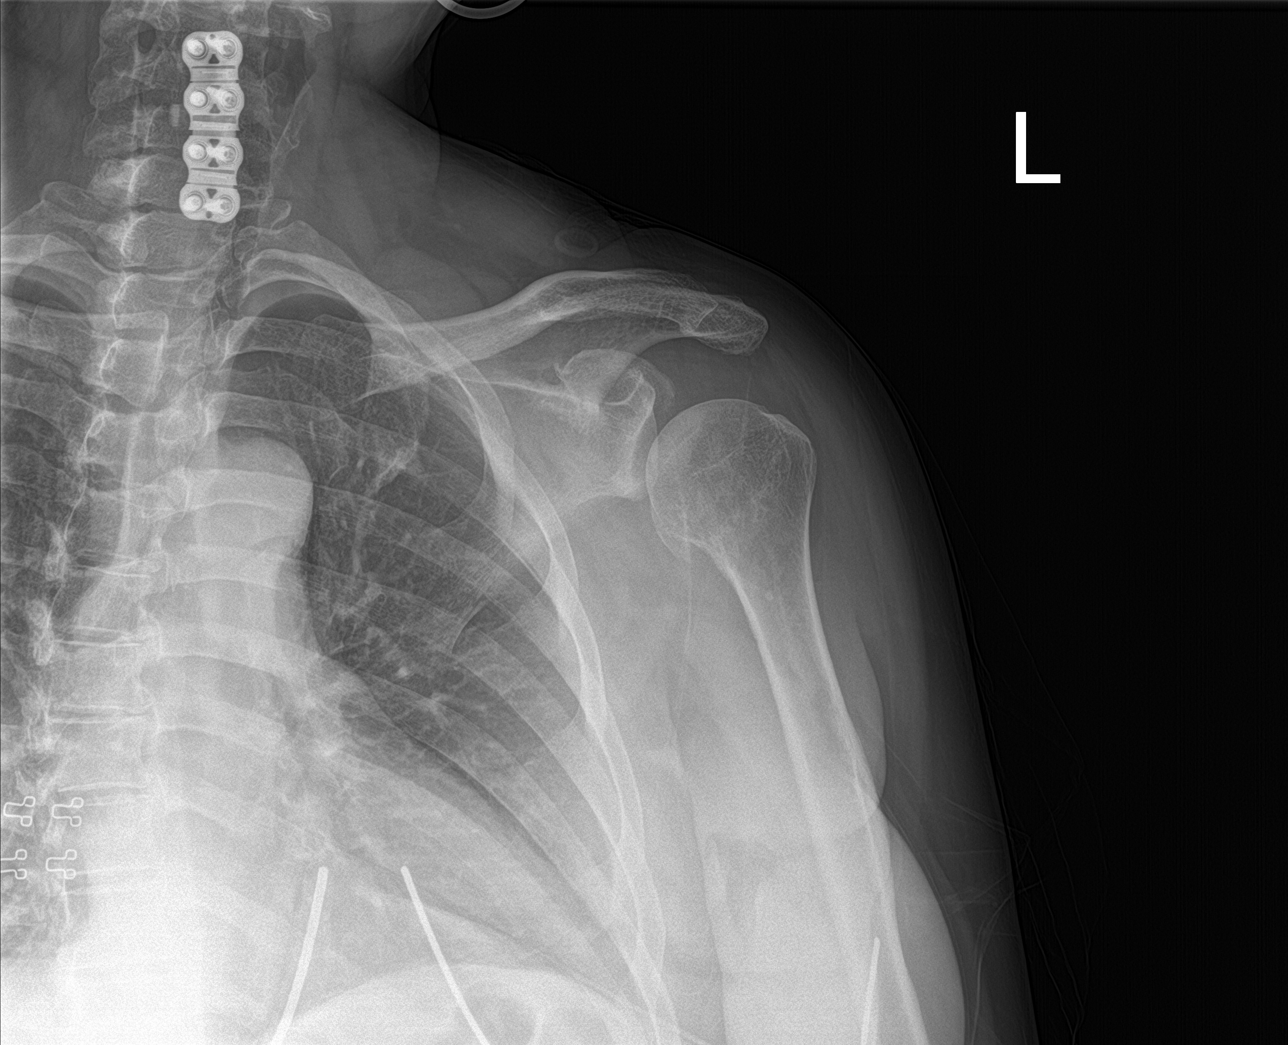

[shoulder y-view]
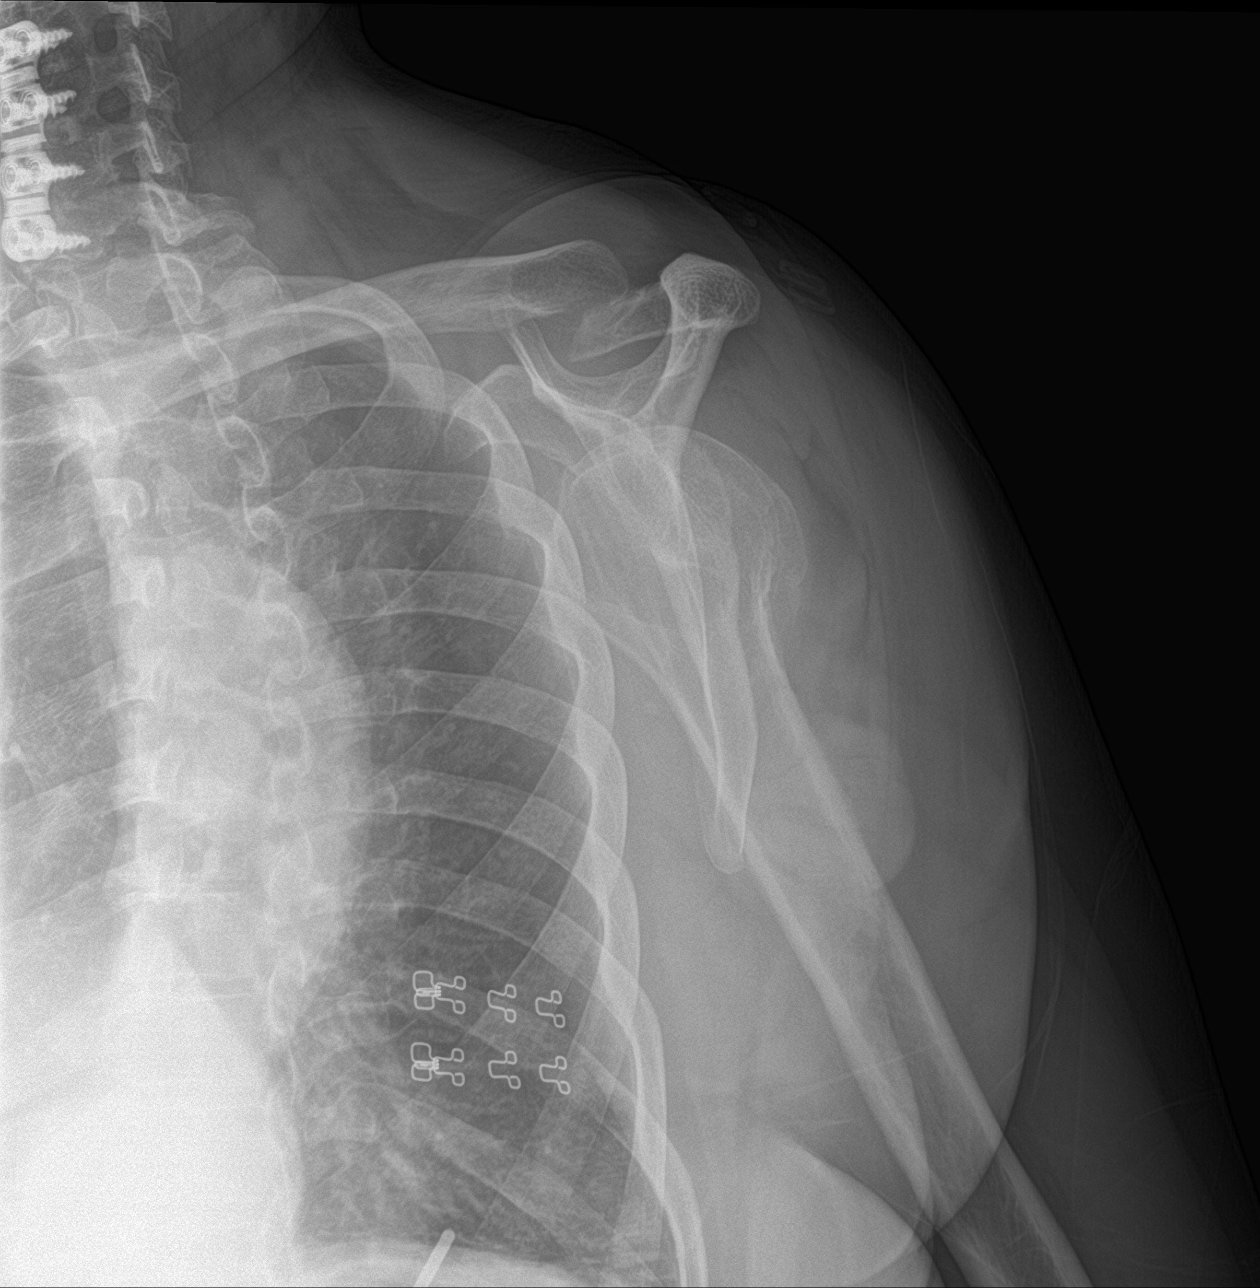

[3 of 3 positions shown; findings below may reference images not displayed]

FINDINGS: Degenerative changes in the left AC and glenohumeral joints with
joint space narrowing and spurring. No acute bony abnormality.
Specifically, no fracture, subluxation, or dislocation.
IMPRESSION: Mild degenerative changes in the left AC and glenohumeral joints. No
acute bony abnormality.

## 2023-04-29 ENCOUNTER — Encounter (HOSPITAL_BASED_OUTPATIENT_CLINIC_OR_DEPARTMENT_OTHER): Payer: Medicare HMO | Admitting: Pulmonary Disease

## 2023-05-17 ENCOUNTER — Encounter (HOSPITAL_BASED_OUTPATIENT_CLINIC_OR_DEPARTMENT_OTHER): Payer: Medicare HMO | Admitting: Pulmonary Disease

## 2023-07-26 ENCOUNTER — Other Ambulatory Visit: Payer: Self-pay | Admitting: Family Medicine

## 2023-07-26 DIAGNOSIS — Z0000 Encounter for general adult medical examination without abnormal findings: Secondary | ICD-10-CM

## 2023-08-25 ENCOUNTER — Other Ambulatory Visit: Payer: Self-pay

## 2023-08-25 ENCOUNTER — Encounter (HOSPITAL_COMMUNITY): Payer: Self-pay

## 2023-08-25 ENCOUNTER — Emergency Department (HOSPITAL_COMMUNITY): Payer: Medicare HMO

## 2023-08-25 ENCOUNTER — Emergency Department (HOSPITAL_COMMUNITY)
Admission: EM | Admit: 2023-08-25 | Discharge: 2023-08-25 | Disposition: A | Discharge status: Home / Self Care | Payer: Medicare HMO | Attending: Emergency Medicine | Admitting: Emergency Medicine

## 2023-08-25 DIAGNOSIS — R0789 Other chest pain: Secondary | ICD-10-CM | POA: Insufficient documentation

## 2023-08-25 DIAGNOSIS — Z7982 Long term (current) use of aspirin: Secondary | ICD-10-CM | POA: Diagnosis not present

## 2023-08-25 DIAGNOSIS — F17200 Nicotine dependence, unspecified, uncomplicated: Secondary | ICD-10-CM | POA: Insufficient documentation

## 2023-08-25 DIAGNOSIS — M79602 Pain in left arm: Secondary | ICD-10-CM | POA: Diagnosis not present

## 2023-08-25 LAB — BASIC METABOLIC PANEL
Anion gap: 9 (ref 5–15)
BUN: 11 mg/dL (ref 6–20)
CO2: 22 mmol/L (ref 22–32)
Calcium: 9 mg/dL (ref 8.9–10.3)
Chloride: 108 mmol/L (ref 98–111)
Creatinine, Ser: 0.94 mg/dL (ref 0.44–1.00)
GFR, Estimated: >60 mL/min (ref >60)
Glucose, Bld: 114 mg/dL — ABNORMAL HIGH (ref 70–99)
Potassium: 3.9 mmol/L (ref 3.5–5.1)
Sodium: 139 mmol/L (ref 135–145)

## 2023-08-25 LAB — CBC
HCT: 39.9 % (ref 36.0–46.0)
Hemoglobin: 13.5 g/dL (ref 12.0–15.0)
MCH: 27.6 pg (ref 26.0–34.0)
MCHC: 33.8 g/dL (ref 30.0–36.0)
MCV: 81.6 fL (ref 80.0–100.0)
Platelets: 317 10*3/uL (ref 150–400)
RBC: 4.89 MIL/uL (ref 3.87–5.11)
RDW: 13.4 % (ref 11.5–15.5)
WBC: 5.9 10*3/uL (ref 4.0–10.5)
nRBC: 0 % (ref 0.0–0.2)

## 2023-08-25 LAB — TROPONIN I (HIGH SENSITIVITY): Troponin I (High Sensitivity): 2 ng/L (ref <18)

## 2023-08-25 MED ORDER — LIDOCAINE 5 % EX PTCH: 1.0000 | MEDICATED_PATCH | CUTANEOUS | 0 refills | Status: AC

## 2023-08-25 MED ORDER — IBUPROFEN 800 MG PO TABS: 800.0000 mg | ORAL_TABLET | Freq: Three times a day (TID) | ORAL | 0 refills | Status: AC | PRN

## 2023-08-25 NOTE — ED Notes (Signed)
Pt verbalized understanding of discharge instructions. Pt ambulated from ed with steady gait.  

## 2023-08-25 NOTE — ED Triage Notes (Addendum)
Patient reports having chest pain that began Sunday. She states it felt like pressure in the center of her chest but it eased off. Today she said the pressure woke her from sleep and she can feel it in her left arm. Takes daily ASA. Rates pain 8/10. Patient was short of breath when ambulating to triage.

## 2023-08-25 NOTE — Discharge Instructions (Addendum)
As discussed, your labs and EKG do not indicate a heart attack.  I have sent a prescription for Advil and lidocaine patches to your pharmacy.  You can take the Advil up to 3 times a day as needed for chest pain.  Additionally, you can apply a lidocaine patch to the area of pain on your chest once a day as needed.  I have sent a referral for cardiology.  They will call you in the next several days to schedule an appointment for further evaluation of your nonspecific chest pain.  Get help right away if: You feel sick to your stomach (nauseous) or you throw up (vomit). You feel sweaty or light-headed. You have a cough with mucus from your lungs (sputum) or you cough up blood. You are short of breath.

## 2023-08-25 NOTE — ED Provider Notes (Signed)
[place] EMERGENCY DEPARTMENT AT [facility] Provider Note   CSN: [id number] Arrival date & time: 08/25/23  0600     History  Chief Complaint  Patient presents with   Chest Pain    Megan Spears is a 62 y.o. female with a history of hyperlipidemia, GERD, insomnia, and tobacco use who presents to the ED today for chest pain. Patient reports intermittent chest pain localized to the center of her sternum for the past 3 days. This morning patient states that in addition to the pain in the chest, she developed left arm pain. Pain is worse with movement and deep breathing. She denies any injury or trauma to the chest. She has not tried any medication to alleviate the sensation. No weakness or numbness to the arm. Patient has never experienced anything like this before. Denies shortness of breath, back pain, or abdominal pain. No additional concerns or complaints at this time.    Home Medications Prior to Admission medications   Medication Sig Start Date End Date Taking? Authorizing Provider  ibuprofen (ADVIL) 800 MG tablet Take 1 tablet (800 mg total) by mouth every 8 (eight) hours as needed for up to 14 days. 08/25/23 09/08/23 Yes Maxwell Marion, PA-C  lidocaine (LIDODERM) 5 % Place 1 patch onto the skin daily for 7 days. Remove & Discard patch within 12 hours or as directed by MD 08/25/23 09/01/23 Yes Maxwell Marion, PA-C  acyclovir (ZOVIRAX) 800 MG tablet Take 1 tablet (800 mg total) by mouth 5 (five) times daily. Patient not taking: Reported on 11/16/2022 06/02/17   Deatra Canter, FNP  aspirin 81 MG tablet Take 81 mg by mouth daily.      [provider]  atorvastatin (LIPITOR) 20 MG tablet Take by mouth. 12/04/19   [provider]  fluconazole (DIFLUCAN) 150 MG tablet Take 150 mg by mouth daily. Patient not taking: Reported on 11/16/2022    [provider]  gabapentin (NEURONTIN) 300 MG capsule Take one po tid for pain Patient not taking: Reported on  11/16/2022 06/02/17   Deatra Canter, FNP  meclizine (ANTIVERT) 12.5 MG tablet Take 1 tablet (12.5 mg total) by mouth 3 (three) times daily as needed for dizziness. 02/28/23   Wallis Bamberg, PA-C  meloxicam (MOBIC) 7.5 MG tablet Take 1 tablet (7.5 mg total) by mouth daily. 05/22/22   Carlisle Beers, FNP  methocarbamol (ROBAXIN) 500 MG tablet Take 1 tablet (500 mg total) by mouth 2 (two) times daily. 05/22/22   Carlisle Beers, FNP  Multiple Vitamins-Minerals (MULTIVITAMIN WITH MINERALS) tablet Take 1 tablet by mouth daily.      [provider]  omeprazole (PRILOSEC) 40 MG capsule Take 40 mg by mouth daily.  Patient not taking: Reported on 11/16/2022    [provider]  oxyCODONE-acetaminophen (PERCOCET) 10-325 MG tablet Take 1 tablet by mouth every 4 (four) hours as needed for pain.    [provider]  pregabalin (LYRICA) 100 MG capsule Take 100 mg by mouth 3 (three) times daily.    [provider]  rosuvastatin (CRESTOR) 20 MG tablet Take 20 mg by mouth daily.    [provider]  traZODone (DESYREL) 50 MG tablet Take 50 mg by mouth at bedtime.    [provider]  zolpidem (AMBIEN) 10 MG tablet Take 10 mg by mouth at bedtime as needed for sleep.    [provider]      Allergies    Patient has no known  allergies.    Review of Systems   Review of Systems  Cardiovascular:  Positive for chest pain.  All other systems reviewed and are negative.   Physical Exam Updated Vital Signs BP (!) 149/111 (BP Location: Right Arm)   Pulse 79   Temp 98.3 F (36.8 C) (Oral)   Resp 20   Ht 5\' 8"  (1.727 m)   Wt 109.8 kg   SpO2 97%   BMI 36.80 kg/m  Physical Exam Vitals and nursing note reviewed.  Constitutional:      General: She is not in acute distress.    Appearance: Normal appearance.  HENT:     Head: Normocephalic and atraumatic.     Mouth/Throat:     Mouth: Mucous membranes are moist.  Eyes:     Conjunctiva/sclera:  Conjunctivae normal.     Pupils: Pupils are equal, round, and reactive to light.  Cardiovascular:     Rate and Rhythm: Normal rate and regular rhythm.     Pulses: Normal pulses.     Heart sounds: Normal heart sounds.  Pulmonary:     Effort: Pulmonary effort is normal.     Breath sounds: Normal breath sounds.  Abdominal:     Palpations: Abdomen is soft.     Tenderness: There is no abdominal tenderness.  Musculoskeletal:     Right lower leg: No edema.     Left lower leg: No edema.  Skin:    General: Skin is warm and dry.     Findings: No rash.  Neurological:     General: No focal deficit present.     Mental Status: She is alert.     Sensory: No sensory deficit.     Motor: No weakness.  Psychiatric:        Mood and Affect: Mood normal.        Behavior: Behavior normal.     ED Results / Procedures / Treatments   Labs (all labs ordered are listed, but only abnormal results are displayed) Labs Reviewed  BASIC METABOLIC PANEL - Abnormal; Notable for the following components:      Result Value   Glucose, Bld 114 (*)    All other components within normal limits  CBC  TROPONIN I (HIGH SENSITIVITY)    EKG None  Radiology DG Chest 2 View  Result Date: 08/25/2023 CLINICAL DATA:  62 year old female with chest pain. EXAM: CHEST - 2 VIEW COMPARISON:  Chest radiographs 02/28/2023 and earlier. FINDINGS: Lung volumes and mediastinal contours are stable, within normal limits. Visualized tracheal air column is within normal limits. Stable lung markings. No pneumothorax, pleural effusion or confluent lung opacity. Chronic cervical ACDF. No acute osseous abnormality identified. Negative visible bowel gas pattern. IMPRESSION: No acute cardiopulmonary abnormality. Electronically Signed   By: Odessa Fleming M.D.   On: 08/25/2023 06:49    Procedures Procedures: not indicated.   Medications Ordered in ED Medications - No data to display  ED Course/ Medical Decision Making/ A&P                                  Medical Decision Making Amount and/or Complexity of Data Reviewed Labs: ordered. Radiology: ordered.   This patient presents to the ED for concern of chest pain, this involves an extensive number of treatment options, and is a complaint that carries with it a high risk of complications and morbidity.   Differential diagnosis includes: ACS, pneumonia, pneumothorax, pleural  effusion, costochondritis, pleurisy, etc.   Comorbidities  See HPI above   Additional History  Additional history obtained from patient's previous records.   Cardiac Monitoring / EKG  The patient was maintained on a cardiac monitor.  I personally viewed and interpreted the cardiac monitored which showed: NSR with a heart rate of 74 bpm. Similar to prior EKG   Lab Tests  I ordered and personally interpreted labs.  The pertinent results include:   Troponin of 2 BMP is within normal limits, no acute electrolyte abnormality or AKI. CBC is unremarkable, no acute infection or anemia.   Imaging Studies  I ordered imaging studies including CXR  I independently visualized and interpreted imaging which showed: no acute cardiopulmonary abnormality. I agree with the radiologist interpretation   Problem List / ED Course / Critical Interventions / Medication Management  Chest pain Patient denies need for any medication for pain medication at this time. I have reviewed the patients home medicines and have made adjustments as needed   Social Determinants of Health  Tobacco use   Test / Admission - Considered  Discussed results with patient.  All questions are answered. She is hemodynamically stable and safe for discharge home. Referral for cardiology sent electronically. Prescriptions for ibuprofen and lidocaine patches sent to the pharmacy. Return precautions provided.       Final Clinical Impression(s) / ED Diagnoses Final diagnoses:  Atypical chest pain    Rx / DC  Orders ED Discharge Orders          Ordered    ibuprofen (ADVIL) 800 MG tablet  Every 8 hours PRN        08/25/23 0711    lidocaine (LIDODERM) 5 %  Every 24 hours        08/25/23 0711    Ambulatory referral to Cardiology       Comments: If you have not heard from the Cardiology office within the next 72 hours please call [phone].   08/25/23 0712              Maxwell Marion, PA-C 08/25/23 0715    Cathren Laine, MD 08/25/23 2011

## 2023-09-06 ENCOUNTER — Ambulatory Visit: Payer: Medicare HMO

## 2023-09-24 ENCOUNTER — Ambulatory Visit: Payer: Medicare HMO

## 2023-09-24 ENCOUNTER — Ambulatory Visit
Admission: RE | Admit: 2023-09-24 | Discharge: 2023-09-24 | Disposition: A | Discharge status: Home / Self Care | Payer: Medicare HMO | Source: Ambulatory Visit | Attending: Family Medicine | Admitting: Family Medicine

## 2023-09-24 DIAGNOSIS — Z0000 Encounter for general adult medical examination without abnormal findings: Secondary | ICD-10-CM

## 2023-10-27 ENCOUNTER — Ambulatory Visit: Payer: Medicare HMO | Admitting: Internal Medicine

## 2023-11-08 ENCOUNTER — Encounter: Payer: Self-pay | Admitting: Internal Medicine

## 2023-11-08 ENCOUNTER — Ambulatory Visit: Payer: Medicare HMO | Attending: Internal Medicine | Admitting: Internal Medicine

## 2023-11-08 VITALS — BP 118/84 | HR 81 | Ht 68.0 in | Wt 229.0 lb

## 2023-11-08 DIAGNOSIS — R079 Chest pain, unspecified: Secondary | ICD-10-CM | POA: Diagnosis not present

## 2023-11-08 DIAGNOSIS — R0602 Shortness of breath: Secondary | ICD-10-CM | POA: Diagnosis not present

## 2023-11-08 MED ORDER — METOPROLOL TARTRATE 100 MG PO TABS: 100.0000 mg | ORAL_TABLET | ORAL | 0 refills | Status: AC

## 2023-11-08 NOTE — Patient Instructions (Addendum)
Medication Instructions:  None  *If you need a refill on your cardiac medications before your next appointment, please call your pharmacy*   Lab Work: BNP  BMP If you have labs (blood work) drawn today and your tests are completely normal, you will receive your results only by: MyChart Message (if you have MyChart) OR A paper copy in the mail If you have any lab test that is abnormal or we need to change your treatment, we will call you to review the results.   Testing/Procedures:  Your physician has requested that you have coronary  CTA. Coronary computed tomography (CT)angiogram  is a special type of CT scan that uses a computer to produce multi-dimensional views of major blood vessels throughout the heart.  CT angiography, a contrast material is injected through an IV to help visualize the blood vessels  a painless test that uses an x-ray machine to take clear, detailed pictures of your heart arteries .  Please follow instruction sheet as given.   Follow-Up: At [facility], you and your health needs are our priority.  As part of our continuing mission to provide you with exceptional heart care, we have created designated Provider Care Teams.  These Care Teams include your primary Cardiologist (physician) and Advanced Practice Providers (APPs -  Physician Assistants and Nurse Practitioners) who all work together to provide you with the care you need, when you need it.  We recommend signing up for the patient portal called "MyChart".  Sign up information is provided on this After Visit Summary.  MyChart is used to connect with patients for Virtual Visits (Telemedicine).  Patients are able to view lab/test results, encounter notes, upcoming appointments, etc.  Non-urgent messages can be sent to your provider as well.   To learn more about what you can do with MyChart, go to [email or web address].    Your next appointment:   Follow up as needed depending on results   Provider:    Maisie Fus, MD     Other Instructions   Your cardiac CT will be scheduled at one of the below locations:   [facility] [address] [place], [place] [address] [phone]    Please arrive at the [facility] Entr[facility] (Entrance C2) of [facility] 30 minutes prior to test start time. You can use the FREE valet parking offered at entrance C (encouraged to control the heart rate for the test)  Proceed to the [facility] Radiology Department (first floor) to check-in and test prep.  All radiology patients and guests should use entrance C2 at [facility], accessed from [facility], even though the hospital's physical address listed is [address].     Please follow these instructions carefully (unless otherwise directed):  An IV will be required for this test and Nitroglycerin will be given.    On the Night Before the Test: Be sure to Drink plenty of water. Do not consume any caffeinated/decaffeinated beverages or chocolate 12 hours prior to your test. Do not take any antihistamines 12 hours prior to your test.  On the Day of the Test: Drink plenty of water until 1 hour prior to the test. Do not eat any food 1 hour prior to test. You may take your regular medications prior to the test.  Take metoprolol  100 mg (Lopressor) two hours prior to test.           FEMALES- please wear underwire-free bra if available,  avoid dresses & tight clothing        After the Test: Drink plenty of water. After receiving IV contrast, you may experience a mild flushed feeling. This is normal. On occasion, you may experience a mild rash up to 24 hours after the test. This is not dangerous. If this occurs, you can take Benadryl 25 mg and increase your fluid intake. If you experience trouble breathing, this can be serious. If it is severe call 911 IMMEDIATELY. If it is mild, please call our office.   We will call to schedule  your test 2-4 weeks out understanding that some insurance companies will need an authorization prior to the service being performed.   For more information and frequently asked questions, please visit our website : [email or web address]  For non-scheduling related questions, please contact the cardiac imaging nurse navigator should you have any questions/concerns: Cardiac Imaging Nurse Navigators Direct Office Dial: [phone]   For scheduling needs, including cancellations and rescheduling, please call [place], [phone].

## 2023-11-08 NOTE — Progress Notes (Signed)
  Cardiology Office Note:  .   Date:  11/08/2023  ID:  Megan Spears, DOB 03/05/1961, MRN [id number] PCP: Beam, Chales Salmon, MD  [facility] Providers Cardiologist:  Maisie Fus, MD    History of Present Illness: .   Megan Spears is a 62 y.o. female with a history of hyperlipidemia, GERD, and smoking.  She is here for referral for atypical chest pain from her primary care doctor, Dr. Avel Peace  She presents with chronic left-sided and mid-sternal chest pain for about a year. The pain, initially thought to be heartburn, is described as sharp and is not associated with any specific activities.  She manages the pain by calming herself down and has been taking Prilosec. She has a strong family history of heart disease, with recent heart attacks in her sister and mother, and a past heart attack in her father.  She also reports chronic shortness of breath, particularly when climbing stairs, and difficulty sleeping. She denies any diabetes.     She was evaluated at the end of August for these symptoms.  She had a negative workup for ACS.  Her ECG showed normal sinus rhythm.  She had a prior negative GXT in 2018. TTE was normal.  ROS:  per HPI otherwise negative   Studies Reviewed: Marland Kitchen     EKG Interpretation Date/Time:  Monday November 08 2023 15:30:34 EST Ventricular Rate:  77 PR Interval:  164 QRS Duration:  74 QT Interval:  374 QTC Calculation: 423 R Axis:   65  Text Interpretation: Normal sinus rhythm Cannot rule out Anteroseptal infarct , age undetermined When compared with ECG of 25-Aug-2023 05:51, Minimal criteria for Anteroseptal infarct are now Present Confirmed by Carolan Clines ([id number]) on 11/08/2023 3:33:41 PM  Risk Assessment/Calculations:        Physical Exam:   VS:  BP 118/84 (BP Location: Left Arm, Patient Position: Sitting, Cuff Size: Normal)   Pulse 81   Ht 5\' 8"  (1.727 m)   Wt 229 lb (103.9 kg)   SpO2 94%   BMI 34.82 kg/m    Wt Readings from Last 3 Encounters:   11/08/23 229 lb (103.9 kg)  08/25/23 242 lb (109.8 kg)  02/02/23 232 lb 9.6 oz (105.5 kg)    GEN: Well nourished, well developed in no acute distress.  Obese NECK: No JVD; No carotid bruits CARDIAC: RRR, no murmurs, rubs, gallops RESPIRATORY:  Clear to auscultation without rales, wheezing or rhonchi  ABDOMEN: Soft, non-tender, non-distended EXTREMITIES:  No edema; No deformity   ASSESSMENT AND PLAN: .       Chest Pain Chronic, sharp, left-sided chest pain. History of smoking and strong family history of heart disease. Prior negative stress test and normal echocardiogram. Symptoms not typical for myocardial ischemia, but given risk factors, further evaluation warranted. -Order coronary CT angiography with morphology to assess for coronary artery disease.  Shortness of Breath New onset, limiting daily activities. No associated leg swelling. Unclear etiology, but could be deconditioning or cardiac related. She is euvolemic, on exam. Had normal prior echo. Have low suspicion for CHF. -Order B-type natriuretic peptide (BNP) blood test to evaluate for possible heart failure.  Follow-up Pending results of coronary CT angiography and BNP. If abnormal findings, will discuss further management with patient.      Signed, Maisie Fus, MD

## 2023-11-09 LAB — BASIC METABOLIC PANEL
BUN/Creatinine Ratio: 23 (ref 12–28)
BUN: 22 mg/dL (ref 8–27)
CO2: 23 mmol/L (ref 20–29)
Calcium: 9.3 mg/dL (ref 8.7–10.3)
Chloride: 105 mmol/L (ref 96–106)
Creatinine, Ser: 0.97 mg/dL (ref 0.57–1.00)
Glucose: 77 mg/dL (ref 70–99)
Potassium: 4.6 mmol/L (ref 3.5–5.2)
Sodium: 143 mmol/L (ref 134–144)
eGFR: 67 mL/min/{1.73_m2} (ref >59)

## 2023-11-09 LAB — BRAIN NATRIURETIC PEPTIDE: BNP: 5 pg/mL (ref 0.0–100.0)

## 2023-11-15 ENCOUNTER — Telehealth: Payer: Self-pay | Admitting: Internal Medicine

## 2023-11-15 NOTE — Telephone Encounter (Signed)
Called patient. Patient made aware of the lab results. Verbalized understanding. No questions or concerns expressed at this time.   

## 2023-11-15 NOTE — Telephone Encounter (Signed)
Patient is calling to follow up on lab results. Please advise 

## 2023-11-23 ENCOUNTER — Telehealth (HOSPITAL_COMMUNITY): Payer: Self-pay | Admitting: *Deleted

## 2023-11-23 NOTE — Telephone Encounter (Signed)
Reaching out to patient to offer assistance regarding upcoming cardiac imaging study; pt verbalizes understanding of appt date/time, parking situation and where to check in, pre-test NPO status and medications ordered, and verified current allergies; name and call back number provided for further questions should they arise Hayley Sharpe RN Navigator Cardiac Imaging [facility] [phone] office [phone] cell  

## 2023-11-24 ENCOUNTER — Ambulatory Visit (HOSPITAL_COMMUNITY)
Admission: RE | Admit: 2023-11-24 | Discharge: 2023-11-24 | Disposition: A | Discharge status: Home / Self Care | Payer: Medicare HMO | Source: Ambulatory Visit | Attending: Internal Medicine | Admitting: Internal Medicine

## 2023-11-24 DIAGNOSIS — R0602 Shortness of breath: Secondary | ICD-10-CM | POA: Insufficient documentation

## 2023-11-24 DIAGNOSIS — R079 Chest pain, unspecified: Secondary | ICD-10-CM | POA: Diagnosis present

## 2023-11-24 DIAGNOSIS — I2510 Atherosclerotic heart disease of native coronary artery without angina pectoris: Secondary | ICD-10-CM | POA: Insufficient documentation

## 2023-11-24 DIAGNOSIS — R072 Precordial pain: Secondary | ICD-10-CM | POA: Diagnosis not present

## 2023-11-24 MED ORDER — IOHEXOL 350 MG/ML SOLN
95.0000 mL | Freq: Once | INTRAVENOUS | Status: AC | PRN
  Administered 2023-11-24: 95 mL via INTRAVENOUS

## 2023-11-24 MED ORDER — NITROGLYCERIN 0.4 MG SL SUBL
SUBLINGUAL_TABLET | SUBLINGUAL | Status: AC
  Filled 2023-11-24: qty 2

## 2023-11-24 MED ORDER — NITROGLYCERIN 0.4 MG SL SUBL
0.8000 mg | SUBLINGUAL_TABLET | Freq: Once | SUBLINGUAL | Status: AC
  Administered 2023-11-24: 0.8 mg via SUBLINGUAL
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
## 2024-03-29 ENCOUNTER — Other Ambulatory Visit: Payer: Self-pay | Admitting: Orthopaedic Surgery

## 2024-03-29 DIAGNOSIS — M4326 Fusion of spine, lumbar region: Secondary | ICD-10-CM

## 2024-03-29 DIAGNOSIS — M5416 Radiculopathy, lumbar region: Secondary | ICD-10-CM

## 2024-03-31 ENCOUNTER — Encounter: Payer: Self-pay | Admitting: Orthopaedic Surgery

## 2024-04-03 ENCOUNTER — Ambulatory Visit
Admission: RE | Admit: 2024-04-03 | Discharge: 2024-04-03 | Disposition: A | Discharge status: Home / Self Care | Source: Ambulatory Visit | Attending: Orthopaedic Surgery | Admitting: Orthopaedic Surgery

## 2024-04-03 DIAGNOSIS — M5416 Radiculopathy, lumbar region: Secondary | ICD-10-CM

## 2024-04-03 DIAGNOSIS — M4326 Fusion of spine, lumbar region: Secondary | ICD-10-CM

## 2024-04-11 ENCOUNTER — Ambulatory Visit (INDEPENDENT_AMBULATORY_CARE_PROVIDER_SITE_OTHER): Payer: Medicare HMO | Admitting: Rheumatology

## 2024-04-11 ENCOUNTER — Encounter: Payer: Self-pay | Admitting: Rheumatology

## 2024-04-11 VITALS — BP 115/79 | HR 67 | Resp 15 | Ht 67.0 in | Wt 224.2 lb

## 2024-04-11 DIAGNOSIS — E559 Vitamin D deficiency, unspecified: Secondary | ICD-10-CM

## 2024-04-11 DIAGNOSIS — M2550 Pain in unspecified joint: Secondary | ICD-10-CM

## 2024-04-11 DIAGNOSIS — K219 Gastro-esophageal reflux disease without esophagitis: Secondary | ICD-10-CM

## 2024-04-11 DIAGNOSIS — M48062 Spinal stenosis, lumbar region with neurogenic claudication: Secondary | ICD-10-CM

## 2024-04-11 DIAGNOSIS — Z720 Tobacco use: Secondary | ICD-10-CM

## 2024-04-11 DIAGNOSIS — F5101 Primary insomnia: Secondary | ICD-10-CM

## 2024-04-11 DIAGNOSIS — M5030 Other cervical disc degeneration, unspecified cervical region: Secondary | ICD-10-CM

## 2024-04-11 DIAGNOSIS — E782 Mixed hyperlipidemia: Secondary | ICD-10-CM

## 2024-04-12 NOTE — Progress Notes (Signed)
 This encounter was created in error - please disregard. The appointment was canceled after the rooming process was completed. It was discovered that the patient is an existing patient at [facility] and the patient is happy with her care at their office.

## 2024-05-10 ENCOUNTER — Ambulatory Visit: Payer: Medicare HMO | Admitting: Rheumatology

## 2024-08-17 ENCOUNTER — Other Ambulatory Visit: Payer: Self-pay | Admitting: Family Medicine

## 2024-08-17 DIAGNOSIS — Z1231 Encounter for screening mammogram for malignant neoplasm of breast: Secondary | ICD-10-CM

## 2024-09-25 ENCOUNTER — Ambulatory Visit
Admission: RE | Admit: 2024-09-25 | Discharge: 2024-09-25 | Disposition: A | Discharge status: Home / Self Care | Source: Ambulatory Visit | Attending: Family Medicine | Admitting: Family Medicine

## 2024-09-25 DIAGNOSIS — Z1231 Encounter for screening mammogram for malignant neoplasm of breast: Secondary | ICD-10-CM
